# Patient Record
Sex: Male | Born: 1985 | Race: Black or African American | Hispanic: No | Marital: Single | Smoking: Current some day smoker
Health system: Southern US, Community
[De-identification: ages and names within clinical notes are randomized; demographics above are authoritative.]

## PROBLEM LIST (undated history)

## (undated) DIAGNOSIS — M549 Dorsalgia, unspecified: Secondary | ICD-10-CM

## (undated) HISTORY — PX: NO PAST SURGERIES: SHX2092

---
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
## 2003-01-26 ENCOUNTER — Emergency Department (HOSPITAL_COMMUNITY): Admission: EM | Admit: 2003-01-26 | Discharge: 2003-01-26 | Payer: Self-pay | Admitting: Emergency Medicine

## 2005-06-05 ENCOUNTER — Emergency Department (HOSPITAL_COMMUNITY): Admission: EM | Admit: 2005-06-05 | Discharge: 2005-06-05 | Payer: Self-pay | Admitting: Emergency Medicine

## 2009-01-19 ENCOUNTER — Emergency Department (HOSPITAL_COMMUNITY): Admission: EM | Admit: 2009-01-19 | Discharge: 2009-01-19 | Payer: Self-pay | Admitting: Emergency Medicine

## 2009-10-24 ENCOUNTER — Emergency Department (HOSPITAL_COMMUNITY): Admission: EM | Admit: 2009-10-24 | Discharge: 2009-10-24 | Payer: Self-pay | Admitting: Emergency Medicine

## 2010-04-14 ENCOUNTER — Emergency Department (HOSPITAL_COMMUNITY)
Admission: EM | Admit: 2010-04-14 | Discharge: 2010-04-14 | Disposition: A | Discharge status: Home / Self Care | Payer: Self-pay | Attending: Emergency Medicine | Admitting: Emergency Medicine

## 2010-04-14 DIAGNOSIS — H109 Unspecified conjunctivitis: Secondary | ICD-10-CM | POA: Insufficient documentation

## 2010-04-14 DIAGNOSIS — H5710 Ocular pain, unspecified eye: Secondary | ICD-10-CM | POA: Insufficient documentation

## 2010-07-30 ENCOUNTER — Emergency Department (HOSPITAL_COMMUNITY): Payer: Self-pay

## 2010-07-30 ENCOUNTER — Emergency Department (HOSPITAL_COMMUNITY)
Admission: EM | Admit: 2010-07-30 | Discharge: 2010-07-30 | Disposition: A | Discharge status: Home / Self Care | Payer: Self-pay | Attending: Emergency Medicine | Admitting: Emergency Medicine

## 2010-07-30 DIAGNOSIS — I517 Cardiomegaly: Secondary | ICD-10-CM | POA: Insufficient documentation

## 2010-07-30 DIAGNOSIS — R079 Chest pain, unspecified: Secondary | ICD-10-CM | POA: Insufficient documentation

## 2010-07-30 DIAGNOSIS — R0602 Shortness of breath: Secondary | ICD-10-CM | POA: Insufficient documentation

## 2010-07-30 DIAGNOSIS — R091 Pleurisy: Secondary | ICD-10-CM | POA: Insufficient documentation

## 2011-09-21 ENCOUNTER — Emergency Department (HOSPITAL_COMMUNITY)
Admission: EM | Admit: 2011-09-21 | Discharge: 2011-09-21 | Disposition: A | Discharge status: Home / Self Care | Payer: Self-pay | Attending: Emergency Medicine | Admitting: Emergency Medicine

## 2011-09-21 ENCOUNTER — Encounter (HOSPITAL_COMMUNITY): Payer: Self-pay | Admitting: Emergency Medicine

## 2011-09-21 DIAGNOSIS — S335XXA Sprain of ligaments of lumbar spine, initial encounter: Secondary | ICD-10-CM | POA: Insufficient documentation

## 2011-09-21 DIAGNOSIS — Y9389 Activity, other specified: Secondary | ICD-10-CM | POA: Insufficient documentation

## 2011-09-21 DIAGNOSIS — S39012A Strain of muscle, fascia and tendon of lower back, initial encounter: Secondary | ICD-10-CM

## 2011-09-21 DIAGNOSIS — X58XXXA Exposure to other specified factors, initial encounter: Secondary | ICD-10-CM | POA: Insufficient documentation

## 2011-09-21 DIAGNOSIS — Y998 Other external cause status: Secondary | ICD-10-CM | POA: Insufficient documentation

## 2011-09-21 HISTORY — DX: Dorsalgia, unspecified: M54.9

## 2011-09-21 MED ORDER — HYDROCODONE-ACETAMINOPHEN 5-325 MG PO TABS: ORAL_TABLET | ORAL | Status: AC

## 2011-09-21 MED ORDER — IBUPROFEN 400 MG PO TABS
600.0000 mg | ORAL_TABLET | Freq: Once | ORAL | Status: AC
  Administered 2011-09-21: 600 mg via ORAL
  Filled 2011-09-21: qty 1

## 2011-09-21 MED ORDER — IBUPROFEN 600 MG PO TABS: 600.0000 mg | ORAL_TABLET | Freq: Four times a day (QID) | ORAL | Status: AC | PRN

## 2011-09-21 MED ORDER — METHOCARBAMOL 500 MG PO TABS: 1000.0000 mg | ORAL_TABLET | Freq: Four times a day (QID) | ORAL | Status: AC

## 2011-09-21 MED ORDER — HYDROCODONE-ACETAMINOPHEN 5-325 MG PO TABS
1.0000 | ORAL_TABLET | Freq: Once | ORAL | Status: AC
  Administered 2011-09-21: 1 via ORAL
  Filled 2011-09-21: qty 1

## 2011-09-21 NOTE — ED Provider Notes (Signed)
History     CSN: [id number]  Arrival date & time 09/21/11  2013   None     Chief Complaint  Patient presents with  . Back Pain    (Consider location/radiation/quality/duration/timing/severity/associated sxs/prior treatment) HPI Comments: Patient presents with complaint of acute onset back pain described as shooting in nature that started at 7 PM when he stood up from his couch. Patient was holding his 101-month-old daughter. Patient states that he has had problems with his back over the past year that are intermittent in nature. He typically will take some Aleve which helps the pain. Patient does heavy lifting at work. Tonight, at the onset the patient describes pain shooting down his right upper leg. Pain is worse with certain positions or movements. No treatments prior to arrival and nothing will make it better. No red flag signs and symptoms of lower back pain. No dysuria or trouble urinating.  Patient is a 26 y.o. male presenting with back pain. The history is provided by the patient.  Back Pain  This is a new problem. The current episode started 3 to 5 hours ago. The problem occurs constantly. The problem has not changed since onset.The pain is present in the lumbar spine. The quality of the pain is described as shooting. The pain radiates to the right thigh. The pain is moderate. The symptoms are aggravated by bending, twisting and certain positions. Pertinent negatives include no fever, no numbness, no weight loss, no abdominal pain, no bowel incontinence, no perianal numbness, no bladder incontinence, no dysuria, no tingling and no weakness. He has tried nothing for the symptoms.    Past Medical History  Diagnosis Date  . Back pain     History reviewed. No pertinent past surgical history.  No family history on file.  History  Substance Use Topics  . Smoking status: Never Smoker   . Smokeless tobacco: Not on file  . Alcohol Use: Yes      Review of Systems    Constitutional: Negative for fever, weight loss and unexpected weight change.  Gastrointestinal: Negative for abdominal pain, constipation and bowel incontinence.       Neg for fecal incontinence  Genitourinary: Negative for bladder incontinence, dysuria, hematuria, flank pain and difficulty urinating.       Negative for urinary incontinence or retention  Musculoskeletal: Positive for back pain.  Neurological: Negative for tingling, weakness and numbness.       Negative for saddle paresthesias     Allergies  Review of patient's allergies indicates no known allergies.  Home Medications   Current Outpatient Rx  Name Route Sig Dispense Refill  . MUSCLE RUB 10-15 % EX CREA Topical Apply 1 application topically as needed. For pain      BP 116/64  Pulse 63  Temp 98.6 F (37 C) (Oral)  Resp 16  SpO2 100%  Physical Exam  Nursing note and vitals reviewed. Constitutional: He appears well-developed and well-nourished.  HENT:  Head: Normocephalic and atraumatic.  Eyes: Conjunctivae are normal.  Neck: Normal range of motion.  Abdominal: Soft. There is no tenderness. There is no CVA tenderness.  Musculoskeletal: He exhibits no tenderness.       Cervical back: He exhibits normal range of motion, no tenderness and no bony tenderness.       Thoracic back: He exhibits normal range of motion, no tenderness and no bony tenderness.       Lumbar back: He exhibits tenderness. He exhibits normal range of motion and no bony  tenderness.       Back:       No step-off noted with palpation of spine.   Neurological: He is alert. He has normal reflexes. No sensory deficit. He exhibits normal muscle tone.       5/5 strength in entire lower extremities bilaterally. No sensation deficit.   Skin: Skin is warm and dry.  Psychiatric: He has a normal mood and affect.    ED Course  Procedures (including critical care time)  Labs Reviewed - No data to display No results found.   1. Lumbar strain      10:39 PM Patient seen and examined. Medications ordered.   Vital signs reviewed and are as follows: Filed Vitals:   09/21/11 2028  BP: 116/64  Pulse: 63  Temp: 98.6 F (37 C)  Resp: 16   No red flag s/s of low back pain. Patient was counseled on back pain precautions and told to do activity as tolerated but do not lift, push, or pull heavy objects more than 10 pounds for the next week.  Patient counseled to use ice or heat on back for no longer than 15 minutes every hour.   Patient prescribed muscle relaxer and counseled on proper use of muscle relaxant medication.    Patient prescribed narcotic pain medicine and counseled on proper use of narcotic pain medications. Counseled not to combine this medication with others containing tylenol.   Urged patient not to drink alcohol, drive, or perform any other activities that requires focus while taking either of these medications.  Patient urged to follow-up with PCP if pain does not improve with treatment and rest or if pain becomes recurrent. Urged to return with worsening severe pain, loss of bowel or bladder control, trouble walking.   The patient verbalizes understanding and agrees with the plan.   MDM  Patient with back pain. No neurological deficits. Patient is ambulatory. No warning symptoms of back pain including: loss of bowel or bladder control, night sweats, waking from sleep with back pain, unexplained fevers or weight loss, h/o cancer, IVDU, recent trauma. No concern for cauda equina, epidural abscess, or other serious cause of back pain. No urinary symptoms. Conservative measures such as rest, ice/heat and pain medicine indicated with PCP follow-up if no improvement with conservative management.          Renne Crigler, [place] 09/21/11 [phone]

## 2011-09-21 NOTE — ED Notes (Signed)
C/o lower back that radiates down R leg since bending over to pick daughter up around 7pm tonight.  Reports history of back pain.

## 2011-09-23 NOTE — ED Provider Notes (Signed)
Medical screening examination/treatment/procedure(s) were performed by non-physician practitioner and as supervising physician I was immediately available for consultation/collaboration.   Chinara Hertzberg H Calem Cocozza, MD 09/23/11 0724 

## 2012-09-14 ENCOUNTER — Ambulatory Visit: Payer: Self-pay | Attending: Internal Medicine

## 2012-09-21 ENCOUNTER — Ambulatory Visit: Payer: Self-pay

## 2012-09-22 ENCOUNTER — Ambulatory Visit: Payer: Self-pay

## 2012-11-26 ENCOUNTER — Ambulatory Visit: Payer: BC Managed Care – PPO | Attending: Internal Medicine | Admitting: Internal Medicine

## 2012-11-26 ENCOUNTER — Ambulatory Visit (HOSPITAL_COMMUNITY)
Admission: RE | Admit: 2012-11-26 | Discharge: 2012-11-26 | Disposition: A | Discharge status: Home / Self Care | Payer: BC Managed Care – PPO | Source: Ambulatory Visit | Attending: Internal Medicine | Admitting: Internal Medicine

## 2012-11-26 VITALS — BP 100/66 | HR 65 | Temp 98.0°F | Resp 16 | Wt 151.0 lb

## 2012-11-26 DIAGNOSIS — M47817 Spondylosis without myelopathy or radiculopathy, lumbosacral region: Secondary | ICD-10-CM | POA: Insufficient documentation

## 2012-11-26 DIAGNOSIS — M542 Cervicalgia: Secondary | ICD-10-CM | POA: Insufficient documentation

## 2012-11-26 DIAGNOSIS — M545 Low back pain, unspecified: Secondary | ICD-10-CM | POA: Insufficient documentation

## 2012-11-26 DIAGNOSIS — M549 Dorsalgia, unspecified: Secondary | ICD-10-CM

## 2012-11-26 DIAGNOSIS — Z79899 Other long term (current) drug therapy: Secondary | ICD-10-CM | POA: Insufficient documentation

## 2012-11-26 DIAGNOSIS — M4040 Postural lordosis, site unspecified: Secondary | ICD-10-CM | POA: Insufficient documentation

## 2012-11-26 MED ORDER — ACETAMINOPHEN 325 MG PO TABS: 650.0000 mg | ORAL_TABLET | Freq: Four times a day (QID) | ORAL | Status: DC | PRN

## 2012-11-26 MED ORDER — CYCLOBENZAPRINE HCL 5 MG PO TABS: 5.0000 mg | ORAL_TABLET | Freq: Two times a day (BID) | ORAL | Status: DC | PRN

## 2012-11-26 MED ORDER — NAPROXEN 500 MG PO TABS: 500.0000 mg | ORAL_TABLET | Freq: Two times a day (BID) | ORAL | Status: DC

## 2012-11-26 NOTE — Progress Notes (Signed)
Patient complains of back pain Also neck pain past two weeks Denies any injury

## 2012-11-26 NOTE — Progress Notes (Signed)
Patient Demographics  Matthew Valentine, is a 27 y.o. male  HQI:[id number]  XLK:[id number]  DOB - Nov 30, 1985  Chief Complaint  Patient presents with  . Back Pain        Subjective:   Matthew Valentine today is here to establish primary care. Patient is a 27 year old [place] American male with no significant past medical history presents here to establish primary care. Patient has been having ongoing low back pain for the past one month and takes as needed over-the-counter nonsteroidal anti-inflammatory medications and Tylenol, and also uses a heating pad at times. For the past 2 weeks he is also developing neck pain. He does not have any muscle weakness, urinary incontinence or fecal incontinence. There is no history of fever. He presents for further evaluation and treatment to  Currently patient has no complaints. Patient has also has No headache, No chest pain, No abdominal pain,No Nausea, No new weakness tingling or numbness, No Cough or SOB.  Objective:    Filed Vitals:   11/26/12 0913  BP: 100/66  Pulse: 65  Temp: 98 F (36.7 C)  Resp: 16  Weight: 151 lb (68.493 kg)  SpO2: 100%     ALLERGIES:  No Known Allergies  PAST MEDICAL HISTORY: Past Medical History  Diagnosis Date  . Back pain     PAST SURGICAL HISTORY: History reviewed. No pertinent past surgical history.  FAMILY HISTORY: Family history of prostate cancer  MEDICATIONS AT HOME: Prior to Admission medications   Medication Sig Start Date End Date Taking? Authorizing Provider  acetaminophen (TYLENOL) 325 MG tablet Take 2 tablets (650 mg total) by mouth every 6 (six) hours as needed for pain. 11/26/12   Erin Obando Levora Dredge, MD  cyclobenzaprine (FLEXERIL) 5 MG tablet Take 1 tablet (5 mg total) by mouth 2 (two) times daily as needed for muscle spasms. 11/26/12   Malayja Freund Levora Dredge, MD  Menthol-Methyl Salicylate (MUSCLE RUB) 10-15 % CREA Apply 1 application topically as needed. For pain    Historical Provider, MD   naproxen (NAPROSYN) 500 MG tablet Take 1 tablet (500 mg total) by mouth 2 (two) times daily with a meal. 11/26/12   Maretta Bees, MD    SOCIAL HISTORY:   reports that he has never smoked. He does not have any smokeless tobacco history on file. He reports that he drinks alcohol. He reports that he does not use illicit drugs.  REVIEW OF SYSTEMS:  Constitutional:   No   Fevers, chills, fatigue.  HEENT:    No headaches, Sore throat,   Cardio-vascular: No chest pain,  Orthopnea, swelling in lower extremities, anasarca, palpitations  GI:  No abdominal pain, nausea, vomiting, diarrhea  Resp: No shortness of breath,  No coughing up of blood.No cough.No wheezing.  Skin:  no rash or lesions.  GU:  no dysuria, change in color of urine, no urgency or frequency.  No flank pain.  Musculoskeletal: No joint pain or swelling.  No decreased range of motion.    Psych: No change in mood or affect. No depression or anxiety.  No memory loss.   Exam  General appearance :Awake, alert, not in any distress. Speech Clear. Not toxic Looking HEENT: Atraumatic and Normocephalic, pupils equally reactive to light and accomodation Neck: supple, no JVD. No cervical lymphadenopathy.  Chest:Good air entry bilaterally, no added sounds  CVS: S1 S2 regular, no murmurs.  Abdomen: Bowel sounds present, Non tender and not distended with no gaurding, rigidity or rebound. Extremities: B/L Lower Ext shows no edema,  both legs are warm to touch Neurology: Awake alert, and oriented X 3, CN II-XII intact, Non focal- 5/5 strength in all 4 extremities. Sensation grossly intact. Skin:No Rash Wounds:N/A    Data Review   CBC No results found for this basename: WBC, HGB, HCT, PLT, MCV, MCH, MCHC, RDW, NEUTRABS, LYMPHSABS, MONOABS, EOSABS, BASOSABS, BANDABS, BANDSABD,  in the last 168 hours  Chemistries   No results found for this basename: NA, K, CL, CO2, GLUCOSE, BUN, CREATININE, GFRCGP, CALCIUM, MG, AST,  ALT, ALKPHOS, BILITOT,  in the last 168 hours ------------------------------------------------------------------------------------------------------------------ No results found for this basename: HGBA1C,  in the last 72 hours ------------------------------------------------------------------------------------------------------------------ No results found for this basename: CHOL, HDL, LDLCALC, TRIG, CHOLHDL, LDLDIRECT,  in the last 72 hours ------------------------------------------------------------------------------------------------------------------ No results found for this basename: TSH, T4TOTAL, FREET3, T3FREE, THYROIDAB,  in the last 72 hours ------------------------------------------------------------------------------------------------------------------ No results found for this basename: VITAMINB12, FOLATE, FERRITIN, TIBC, IRON, RETICCTPCT,  in the last 72 hours  Coagulation profile  No results found for this basename: INR, PROTIME,  in the last 168 hours    Assessment & Plan   Back pain/neck - Check lumbar and cervical spine x-rays - Will place on Flexeril, continue with nonsteroidal anti-inflammatory medications and Tylenol as needed - Followup in one month- depending on the x-rays and clinical symptomatology/re-assessment we'll decide on further workup  Will check baseline CBC, chemistries, A1c, lipids and TSH   Follow up in one month  The patient was given clear instructions to go to ER or return to medical center if symptoms don't improve, worsen or new problems develop. The patient verbalized understanding. The patient was told to call to get lab results if they haven't heard anything in the next week.

## 2012-12-06 ENCOUNTER — Ambulatory Visit: Payer: Self-pay

## 2013-08-16 ENCOUNTER — Emergency Department (HOSPITAL_COMMUNITY)
Admission: EM | Admit: 2013-08-16 | Discharge: 2013-08-16 | Disposition: A | Discharge status: Home / Self Care | Payer: No Typology Code available for payment source | Attending: Emergency Medicine | Admitting: Emergency Medicine

## 2013-08-16 ENCOUNTER — Encounter (HOSPITAL_COMMUNITY): Payer: Self-pay | Admitting: Emergency Medicine

## 2013-08-16 DIAGNOSIS — L0211 Cutaneous abscess of neck: Secondary | ICD-10-CM | POA: Insufficient documentation

## 2013-08-16 DIAGNOSIS — L03221 Cellulitis of neck: Principal | ICD-10-CM

## 2013-08-16 DIAGNOSIS — L0291 Cutaneous abscess, unspecified: Secondary | ICD-10-CM

## 2013-08-16 DIAGNOSIS — Z79899 Other long term (current) drug therapy: Secondary | ICD-10-CM | POA: Insufficient documentation

## 2013-08-16 DIAGNOSIS — R51 Headache: Secondary | ICD-10-CM | POA: Insufficient documentation

## 2013-08-16 MED ORDER — IBUPROFEN 800 MG PO TABS: 800.0000 mg | ORAL_TABLET | Freq: Three times a day (TID) | ORAL | Status: DC

## 2013-08-16 MED ORDER — OXYCODONE-ACETAMINOPHEN 5-325 MG PO TABS: 1.0000 | ORAL_TABLET | ORAL | Status: DC | PRN

## 2013-08-16 MED ORDER — CEPHALEXIN 500 MG PO CAPS: 500.0000 mg | ORAL_CAPSULE | Freq: Four times a day (QID) | ORAL | Status: DC

## 2013-08-16 MED ORDER — OXYCODONE-ACETAMINOPHEN 5-325 MG PO TABS
1.0000 | ORAL_TABLET | Freq: Once | ORAL | Status: AC
  Administered 2013-08-16: 1 via ORAL
  Filled 2013-08-16: qty 1

## 2013-08-16 NOTE — ED Notes (Signed)
Pt reports having abscess to back of neck for extended amount of time. Has increased in size and pain. Denies fever or chills.

## 2013-08-16 NOTE — ED Notes (Signed)
sts he had his abscess on neck draiined

## 2013-08-16 NOTE — ED Provider Notes (Signed)
CSN: [id number]     Arrival date & time 08/16/13  1342 History  This chart was scribed for non-physician practitioner Mellody DrownLauren Maryn Freelove working with Ward GivensIva L Knapp, MD by Carl Bestelina Holson, ED Scribe. This patient was seen in room TR08C/TR08C and the patient's care was started at 1:52 PM.   Chief Complaint  Patient presents with  . Abscess   HPI Comments: Matthew Valentine is a 28 y.o. male who presents to the Emergency Department complaining of a painful abscess located on the back of his neck that he noticed weeks ago.  He states that the abscess has gradually increased in size.  The patient describes the pain as burning.  He denies fever as an associated symptom.  He states that he has a history of abscesses.  The patient denies being allergic to any medication.  The patient denies having a history of DM, HIV, or other immunocompromising disease.  He denies having a PCP.    Patient is a 28 y.o. male presenting with abscess. The history is provided by the patient. No language interpreter was used.  Abscess Associated symptoms: no fever     Past Medical History  Diagnosis Date  . Back pain    History reviewed. No pertinent past surgical history. History reviewed. No pertinent family history. History  Substance Use Topics  . Smoking status: Never Smoker   . Smokeless tobacco: Not on file  . Alcohol Use: Yes    Review of Systems  Constitutional: Negative for fever and chills.  Skin: Positive for wound. Negative for rash.  Allergic/Immunologic: Negative for immunocompromised state.  All other systems reviewed and are negative.     Allergies  Review of patient's allergies indicates no known allergies.  Home Medications   Prior to Admission medications   Medication Sig Start Date End Date Taking? Authorizing Provider  acetaminophen (TYLENOL) 325 MG tablet Take 2 tablets (650 mg total) by mouth every 6 (six) hours as needed for pain. 11/26/12   Shanker Levora DredgeM Ghimire, MD  cyclobenzaprine  (FLEXERIL) 5 MG tablet Take 1 tablet (5 mg total) by mouth 2 (two) times daily as needed for muscle spasms. 11/26/12   Shanker Levora DredgeM Ghimire, MD  Menthol-Methyl Salicylate (MUSCLE RUB) 10-15 % CREA Apply 1 application topically as needed. For pain    Historical Provider, MD  naproxen (NAPROSYN) 500 MG tablet Take 1 tablet (500 mg total) by mouth 2 (two) times daily with a meal. 11/26/12   Shanker Levora DredgeM Ghimire, MD   BP 139/81  Pulse 66  Temp(Src) 98.6 F (37 C) (Oral)  Resp 18  SpO2 99% Physical Exam  Nursing note and vitals reviewed. Constitutional: He is oriented to person, place, and time. He appears well-developed and well-nourished.  Non-toxic appearance. He does not have a sickly appearance. He does not appear ill. No distress.  HENT:  Head: Normocephalic and atraumatic.  Eyes: EOM are normal.  Neck: Normal range of motion.  Cardiovascular: Normal rate.   Pulmonary/Chest: Effort normal.  Musculoskeletal: Normal range of motion.  Neurological: He is alert and oriented to person, place, and time.  Skin: Skin is warm and dry. He is not diaphoretic.  3x3 raised area with central fluctuance and surrounding induration. No surrounding erythema  Psychiatric: He has a normal mood and affect. His behavior is normal.    ED Course  Procedures (including critical care time)  INCISION AND DRAINAGE  Performed by: Mellody DrownLauren Janina Trafton, PA-C Authorized by: Devoria AlbeIva Knapp, MD   Consent - Verbal Consent obtained  Risks and benefits: risks/benefits and alternatives were discussed  Type: Abscess  Body Area: posterior neck, hairline  Anesthesia: Local infiltration Local anesthetic: lidocaine 2% with epinephrine  Anesthetic total: 5 ml  Complexity: Complex  Blunt dissection to break up loculations  Drainage: Purulent  Drainage amount: Moderate   Packing material: 1/4 iodoform gauze  Patient tolerance: Patient tolerated the procedure well with no immediate complications   MDM   Final diagnoses:   Abscess   Patient with abscess to posterior neck hairline. I&D tolerated without complaints. No overlying erythema to suggest cellulitis. Plan on wound check in 48 hours for packing removal. Return precautions given. Reports understanding and no other concerns at this time.  Patient is stable for discharge at this time.  Meds given in ED:  Medications - No data to display  Discharge Medication List as of 08/16/2013  3:41 PM    START taking these medications   Details  ibuprofen (ADVIL,MOTRIN) 800 MG tablet Take 1 tablet (800 mg total) by mouth 3 (three) times daily., Starting 08/16/2013, Until Discontinued, Print         I personally performed the services described in this documentation, which was scribed in my presence. The recorded information has been reviewed and is accurate.    Clabe Seal, PA-C 08/17/13 [phone]

## 2013-08-16 NOTE — ED Provider Notes (Signed)
CSN: [id number]     Arrival date & time 08/16/13  1642 History  This chart was scribed for Jaynie Crumble, PA-C working with Richardean Canal, MD by Evon Slack, ED Scribe. This patient was seen in room TR05C/TR05C and the patient's care was started at 5:47 PM.      Chief Complaint  Patient presents with  . Abscess   Patient is a 28 y.o. male presenting with abscess. The history is provided by the patient. No language interpreter was used.  Abscess Associated symptoms: headaches   Associated symptoms: no fever    HPI Comments: Matthew Valentine is a 28 y.o. male who presents to the Emergency Department  For an abscess earlier today. He states he didn't receive any pain medication during his visit earlier today. He states the pain worsened once leaving the ED. He states the pain is throbbing. Denies any fever, chills.  Past Medical History  Diagnosis Date  . Back pain    History reviewed. No pertinent past surgical history. History reviewed. No pertinent family history. History  Substance Use Topics  . Smoking status: Never Smoker   . Smokeless tobacco: Not on file  . Alcohol Use: Yes    Review of Systems  Constitutional: Negative for fever and chills.  Musculoskeletal: Positive for neck pain.  Skin: Positive for wound.  Neurological: Positive for headaches.   Allergies  Review of patient's allergies indicates no known allergies.  Home Medications   Prior to Admission medications   Medication Sig Start Date End Date Taking? Authorizing Provider  ibuprofen (ADVIL,MOTRIN) 800 MG tablet Take 1 tablet (800 mg total) by mouth 3 (three) times daily. 08/16/13   Clabe Seal, PA-C   Triage Vitals: BP 131/89  Pulse 61  Temp(Src) 98.2 F (36.8 C) (Oral)  Resp 20  Ht 5\' 10"  (1.778 m)  Wt 160 lb (72.576 kg)  BMI 22.96 kg/m2  SpO2 99%  Physical Exam  Nursing note and vitals reviewed. Constitutional: He is oriented to person, place, and time. He appears well-developed  and well-nourished. No distress.  HENT:  Head: Normocephalic and atraumatic.  Eyes: Conjunctivae and EOM are normal.  Neck: Neck supple. No tracheal deviation present.  Cardiovascular: Normal rate.   Pulmonary/Chest: Effort normal. No respiratory distress.  Musculoskeletal: Normal range of motion.  Neurological: He is alert and oriented to person, place, and time.  Skin: Skin is warm and dry.  3x3 cm abscess to back of neck already incised and packed , no surrounding erythema, no drainage from abscess. Tender to palpation  Psychiatric: He has a normal mood and affect. His behavior is normal.    ED Course  Procedures (including critical care time) DIAGNOSTIC STUDIES: Oxygen Saturation is 99% on RA, normal by my interpretation.    COORDINATION OF CARE: 6:29 PM-Discussed treatment plan which includes pain medication and antibiotics with pt at bedside and pt agreed to plan.     Labs Review Labs Reviewed - No data to display  Imaging Review No results found.   EKG Interpretation None      MDM   Final diagnoses:  Abscess of neck   Patient was just seen here a few hours ago and had an abscess of the back of the neck incised and drained, packed. He presents here because the pain has worsened since discharge. He did not get any prescriptions for pain medications her in a biotics, requesting an antibiotic. Given her Percocet in emergency department, will discharge home with Percocet and  Keflex. Followup with primary care doctor or here in 2 days.  Filed Vitals:   08/16/13 1649 08/16/13 1653  BP: 131/89   Pulse: 61   Temp: 98.2 F (36.8 C)   TempSrc: Oral   Resp: 20   Height:  5\' 10"  (1.778 m)  Weight:  160 lb (72.576 kg)  SpO2: 99%     I personally performed the services described in this documentation, which was scribed in my presence. The recorded information has been reviewed and is accurate.    Lottie Musselatyana A Lori Liew, PA-C 08/16/13 1840

## 2013-08-16 NOTE — Discharge Instructions (Signed)
Warm compresses to the area. Ibuprofen for pain. Percocet for severe pain. Keflex for infection. Follow up in 2 days. .    Abscess An abscess is an infected area that contains a collection of pus and debris.It can occur in almost any part of the body. An abscess is also known as a furuncle or boil. CAUSES  An abscess occurs when tissue gets infected. This can occur from blockage of oil or sweat glands, infection of hair follicles, or a minor injury to the skin. As the body tries to fight the infection, pus collects in the area and creates pressure under the skin. This pressure causes pain. People with weakened immune systems have difficulty fighting infections and get certain abscesses more often.  SYMPTOMS Usually an abscess develops on the skin and becomes a painful mass that is red, warm, and tender. If the abscess forms under the skin, you may feel a moveable soft area under the skin. Some abscesses break open (rupture) on their own, but most will continue to get worse without care. The infection can spread deeper into the body and eventually into the bloodstream, causing you to feel ill.  DIAGNOSIS  Your caregiver will take your medical history and perform a physical exam. A sample of fluid may also be taken from the abscess to determine what is causing your infection. TREATMENT  Your caregiver may prescribe antibiotic medicines to fight the infection. However, taking antibiotics alone usually does not cure an abscess. Your caregiver may need to make a small cut (incision) in the abscess to drain the pus. In some cases, gauze is packed into the abscess to reduce pain and to continue draining the area. HOME CARE INSTRUCTIONS   Only take over-the-counter or prescription medicines for pain, discomfort, or fever as directed by your caregiver.  If you were prescribed antibiotics, take them as directed. Finish them even if you start to feel better.  If gauze is used, follow your caregiver's  directions for changing the gauze.  To avoid spreading the infection:  Keep your draining abscess covered with a bandage.  Wash your hands well.  Do not share personal care items, towels, or whirlpools with others.  Avoid skin contact with others.  Keep your skin and clothes clean around the abscess.  Keep all follow-up appointments as directed by your caregiver. SEEK MEDICAL CARE IF:   You have increased pain, swelling, redness, fluid drainage, or bleeding.  You have muscle aches, chills, or a general ill feeling.  You have a fever. MAKE SURE YOU:   Understand these instructions.  Will watch your condition.  Will get help right away if you are not doing well or get worse. Document Released: 10/23/2004 Document Revised: 07/15/2011 Document Reviewed: 03/28/2011 [facility] Patient Information 2015 [employer] [place]. This information is not intended to replace advice given to you by your health care provider. Make sure you discuss any questions you have with your health care provider.

## 2013-08-16 NOTE — Discharge Instructions (Signed)
Return to the emergency department for a wound check and packing removal in 48 hours. Call for a follow up appointment with a Family or Primary Care Provider.  Return if Symptoms worsen.   Take medication as prescribed.  Keep the wound clean and dry do not soak in water until the packing has been removed.   Emergency Department Resource Guide 1) Find a Doctor and Pay Out of Pocket Although you won't have to find out who is covered by your insurance plan, it is a good idea to ask around and get recommendations. You will then need to call the office and see if the doctor you have chosen will accept you as a new patient and what types of options they offer for patients who are self-pay. Some doctors offer discounts or will set up payment plans for their patients who do not have insurance, but you will need to ask so you aren't surprised when you get to your appointment.  2) Contact Your Local Health Department Not all health departments have doctors that can see patients for sick visits, but many do, so it is worth a call to see if yours does. If you don't know where your local health department is, you can check in your phone book. The CDC also has a tool to help you locate your state's health department, and many state websites also have listings of all of their local health departments.  3) Find a Walk-in Clinic If your illness is not likely to be very severe or complicated, you may want to try a walk in clinic. These are popping up all over the country in pharmacies, drugstores, and shopping centers. They're usually staffed by nurse practitioners or physician assistants that have been trained to treat common illnesses and complaints. They're usually fairly quick and inexpensive. However, if you have serious medical issues or chronic medical problems, these are probably not your best option.  No Primary Care Doctor: - Call Health Connect at  [phone] - they can help you locate a primary care doctor  that  accepts your insurance, provides certain services, etc. - Physician Referral Service- [phone]  Chronic Pain Problems: Organization         Address  Phone   Notes  [facility]  [phone]) [phone] Patients need to be referred by their primary care doctor.   Medication Assistance: Organization         Address  Phone   Notes  [facility] Medication [facility] [address] [place], [place] [address] [phone] --Must be a resident of [facility] [place] -- Must have NO insurance coverage whatsoever (no Medicaid/ Medicare, etc.) -- The pt. MUST have a primary care doctor that directs their care regularly and follows them in the community   MedAssist  [phone]   [employer]  [phone]    Agencies that provide inexpensive medical care: Organization         Address  Phone   Notes  [facility]  [phone]) [phone]   [facility]    [phone]   [facility] [address] [place], [place] [address] [phone]) [phone]   [facility] [address], [place] [phone]   Planned Parenthood    [phone]   [facility]    [phone]) [phone]   [facility]  [address], [place] Phone:  [phone], Fax:  (  [phone] Hours of Operation:  9 am - 6 pm, M-F.  Also accepts Medicaid/Medicare and self-pay.  [facility] for [place], [address], [place] Phone: [phone], Fax: [phone]. Hours of Operation:  8:30 am - 5:30 pm, M-F.  Also accepts Medicaid and self-pay.  [facility] [place] [address] [place] Phone: [phone]   [place], [place], [place] [phone], Ext. [phone] Mondays & Thursdays: 7-9 AM.  First 15 patients are seen on a first come, first serve basis.    [place] Providers:  Organization          Address  Phone   Notes  [facility] [address], [place] [phone] Also accepts self-pay patients.  [facility] [address], [place]  [phone]   [place], [address], [place] [phone]   [facility] [address] [place] [phone]   Lucianne Lei [address], [place]   [phone] Only accepts [place] Access [place] patients after they have their name applied to their card.   Self-Pay (no insurance) in [facility]:  Organization         Address  Phone   Notes  Sickle Cell Patients, [facility] [place] [phone]   [facility] [place] [phone]   [facility] [place]  [place], [place], [place] [phone]   [facility]/Dr. Osei-Bonsu  [address] [place] or [address], [place] [phone] Phone number for both [place] and [place] locations is the same.  [facility] [address], [place] [phone]   [facility] [address] [place] or [address] [phone]   [facility] [address], [place] [phone], phone; [phone], fax Sees patients 1st and 3rd Saturday of every month.  Must not qualify for public or private insurance (i.e. Medicaid, Medicare, [place] Health Choice, Veterans' Benefits)  Household income should be no more than 200% of the poverty level The clinic cannot treat you if you are pregnant or think you are pregnant  Sexually transmitted diseases are not treated at the clinic.    Dental Care: Organization         Address  Phone  Notes  [facility] of [facility] [place] [phone] Accepts children up to age 53 who are enrolled in [place] or [place]; pregnant women with a Medicaid card; and children who have applied for Medicaid or [place] Health Choice, but were declined, whose parents can pay a reduced fee at time of service.  [facility] of [facility]  [address], [place] [phone] Accepts children up to age 21 who are enrolled in [place] or [place]; pregnant women with a Medicaid card; and children who have applied for Medicaid or [place] Health Choice, but were declined, whose parents can pay a reduced fee at time of service.  [facility] Access PROGRAM  [place] [phone] Patients are seen by appointment only. Walk-ins are not accepted. [place]  [facility] will see patients 54 years of age and older. Monday - Tuesday (8am-5pm) Most Wednesdays (8:30-5pm) $30 per visit, cash only  [facility] Adult Dental Access PROGRAM  [address], [facility] [phone] Patients are seen by appointment only. Walk-ins are not accepted. [place] will see patients 55 years of age and older. One Wednesday Evening (Monthly: Volunteer Based).  $30 per visit, cash only  [place]  [phone] for adults; Children under age 20, call Graduate Pediatric Dentistry at [phone]. Children aged 41-14, please call [phone] to request a pediatric application.  Dental services are provided in all areas of dental care including fillings, crowns and bridges, complete and partial dentures, implants, gum treatment, root canals, and extractions. Preventive care is also provided. Treatment is provided to both adults and children. Patients are selected via a lottery and there is often a waiting list.   [facility] [address], [place]  [phone] [email or web address]   [facility] [address] [place], [place]  [phone], Ext. [phone] Second and Fourth Thursday of each month, opens at 6:30 AM; Clinic ends at 9 AM.  Patients are seen on a first-come first-served basis, and a limited number are seen during each clinic.   [facility]  [address] Hillard Danker [place], [place] [phone]   Eligibility Requirements You must have lived in [place], [place], or [place] counties for at least the last three months.   You cannot be eligible for state or federal sponsored [employer], including [employer], [place], or [employer].   You generally cannot be eligible for healthcare insurance through your employer.    How to apply: Eligibility screenings are held every Tuesday and Wednesday afternoon from 1:00 pm until 4:00 pm. You do not need an appointment for the interview!  [facility] [address] [place], [place]   [place]  [place] Department  [place]  [phone]    Behavioral Health Resources in the Community: Intensive Outpatient Programs Organization         Address  Phone  Notes  [place]. [address] [place], [place] [phone]   [facility] [address] [place], [place]   ADS: Alcohol [facility] Drug S[facility] [address] [place], [place]   [place] [address]  [place], [place] or [phone]   Substance Abuse Resources Organization         Address  Phone  Notes  Alcohol and Drug Services  [phone]   [place]  [phone]   The [place]   Chinita Pester  [phone]   Residential & Outpatient Substance Abuse Program  [phone]   Psychological Services Organization         Address  Phone  Notes  [facility] [place]  [place]  [phone]    [place] [address], [place] or [phone]    Mobile Crisis Teams Organization         Address  Phone  Notes  [facility], Mobile Crisis Care Unit  [phone]   [facility] Psychotherapeutic [facility]  [address] [place], [place]   [facility] [address] [place] [phone]    Self-Help/Support Groups Organization  Address  Phone             Notes  [place]. of [place] - variety of support groups  [place] Call for more information  Narcotics Anonymous (NA), [facility] [address], [employer] [place]  2 meetings at this location   Special educational needs teacher         Address  Phone  Notes  ASAP Residential Treatment [place],    [place]  [phone]   [facility]  [address] [place] [phone], [place], [place]   [place], [place] [phone] Admissions: 8am-3pm M-F  Incentives Substance [place] [address]    [place], [place] [phone]   [facility] [address] [place], [place], [place]   [facility] [address]  [place], [place]   [facility] Programs - Intensive Outpatient [address], [place], [place]   [facility] ([place] [address].,  [place], [place] [phone] or [phone]   [facility]idential Treatment Services (RTS) [address] [place], [place] Accepts Medicaid  [facility] [place] [address]  [place] [phone] Substance Abuse/Addiction Treatment   [facility] Organization         Address  Phone  Notes  [facility]  [phone]   Domenic Schwab, PhD [address] Arlis Porta [place], [place]   [phone] or [phone]   [place]  [place], [place] [phone]   [facility] [address], [place], [place] [phone] Insurance/Medicaid/sponsorship through [facility] and Families [address] [place]                                    [place], [place] [phone] [place] [address], [place] [phone]    Dr. Adele Schilder  [phone]   [facility] Dept. 1) [address] [place] 2) [place] 3)  [place] [address], [place] [phone]  [phone]   [place] [phone] or [phone] (After Hours)

## 2013-08-16 NOTE — ED Notes (Signed)
Pt dischareged 30 minutes ago for same, here now for neck pain, and reports the ibupforen that wrote from him he has taken in the past and he does not see that they will work.

## 2013-08-17 NOTE — ED Provider Notes (Signed)
Medical screening examination/treatment/procedure(s) were performed by non-physician practitioner and as supervising physician I was immediately available for consultation/collaboration.   EKG Interpretation None      Devoria AlbeIva Alesana Magistro, MD, Armando GangFACEP   Ward GivensIva L Abdoulie Tierce, MD 08/17/13 [phone]

## 2013-08-18 ENCOUNTER — Emergency Department (HOSPITAL_COMMUNITY)
Admission: EM | Admit: 2013-08-18 | Discharge: 2013-08-18 | Disposition: A | Discharge status: Home / Self Care | Payer: No Typology Code available for payment source | Attending: Emergency Medicine | Admitting: Emergency Medicine

## 2013-08-18 ENCOUNTER — Encounter (HOSPITAL_COMMUNITY): Payer: Self-pay | Admitting: Emergency Medicine

## 2013-08-18 DIAGNOSIS — Z792 Long term (current) use of antibiotics: Secondary | ICD-10-CM | POA: Insufficient documentation

## 2013-08-18 DIAGNOSIS — L0211 Cutaneous abscess of neck: Secondary | ICD-10-CM | POA: Insufficient documentation

## 2013-08-18 DIAGNOSIS — L03221 Cellulitis of neck: Principal | ICD-10-CM

## 2013-08-18 NOTE — ED Notes (Signed)
DSY to upper neck D&I.

## 2013-08-18 NOTE — ED Provider Notes (Signed)
CSN: [id number]     Arrival date & time 08/18/13  2046 History  This chart was scribed for non-physician practitioner, Fayrene HelperBowie Kross Swallows, PA-C, working with No att. providers found, by Bronson CurbJacqueline Melvin, ED Scribe. This patient was seen in room TR09C/TR09C and the patient's care was started at 9:19 PM.    Chief Complaint  Patient presents with  . Wound Check     The history is provided by the patient. No language interpreter was used.    HPI Comments: Matthew Valentine is a 28 y.o. male who presents to the Emergency Department for wound check. Patient states he had an cyst to the posterior neck that was incised and drained 2 days ago and is here for a follow up. Patient reports his pain still persists. He denies fever. Patient has history of back pain.  Pt does have pain medication and abx which he is taking.  Pt has not perform warm compress as he is not aware of it.     Past Medical History  Diagnosis Date  . Back pain    History reviewed. No pertinent past surgical history. No family history on file. History  Substance Use Topics  . Smoking status: Never Smoker   . Smokeless tobacco: Not on file  . Alcohol Use: Yes    Review of Systems  Constitutional: Negative for fever and chills.  Skin:       Cyst to posterior neck      Allergies  Review of patient's allergies indicates no known allergies.  Home Medications   Prior to Admission medications   Medication Sig Start Date End Date Taking? Authorizing Provider  cephALEXin (KEFLEX) 500 MG capsule Take 1 capsule (500 mg total) by mouth 4 (four) times daily. 08/16/13  Yes Tatyana A Kirichenko, PA-C  oxyCODONE-acetaminophen (PERCOCET) 5-325 MG per tablet Take 1 tablet by mouth every 4 (four) hours as needed for severe pain. 08/16/13  Yes Tatyana A Kirichenko, PA-C   Triage Vitals: BP 116/65  Pulse 61  Temp(Src) 98.5 F (36.9 C) (Oral)  Resp 18  SpO2 98%  Physical Exam  Nursing note and vitals reviewed. Constitutional: He is  oriented to person, place, and time. He appears well-developed and well-nourished. No distress.  HENT:  Head: Normocephalic and atraumatic.  Eyes: Conjunctivae and EOM are normal.  Neck: Neck supple. No tracheal deviation present.  Cardiovascular: Normal rate.   Pulmonary/Chest: Effort normal. No respiratory distress.  Musculoskeletal: Normal range of motion.  Neurological: He is alert and oriented to person, place, and time.  Skin: Skin is warm and dry.  Area of induration and fluctuant noted to the posterior aspect of neck TTP with dressing in place. Old surgical opening actively oozing some pustular discharge.  Psychiatric: He has a normal mood and affect. His behavior is normal.    ED Course  Procedures (including critical care time)  DIAGNOSTIC STUDIES: Oxygen Saturation is 98% on room air, normal by my interpretation.    COORDINATION OF CARE: At 2134 Pt has a cutaneous abscess to back of neck that is not adequately drained.  Still has moderate induration and fluctuance on exam, moderately tender.  Discussed treatment plan with patient which includes I&D. Patient agrees.   INCISION AND DRAINAGE PROCEDURE NOTE: Patient identification was confirmed and verbal consent was obtained. This procedure was performed by Fayrene HelperBowie Makana Feigel, PA-C at 9:34 PM. Site: posterior neck Sterile procedures observed Needle size: 25 gauze Anesthetic used (type and amt): 3 Blade size: 11 Drainage: mild Complexity: Complex  Packing used, 1/4 iodoform packing Site anesthetized, incision made over site, wound drained and explored loculations, rinsed with copious amounts of normal saline, wound packed with sterile gauze, covered with dry, sterile dressing.  Pt tolerated procedure well without complications.  Instructions for care discussed verbally and pt provided with additional written instructions for homecare and f/u.   Labs Review Labs Reviewed - No data to display  Imaging Review No results  found.   EKG Interpretation None      MDM   Final diagnoses:  Cutaneous abscess of neck    BP 116/65  Pulse 61  Temp(Src) 98.5 F (36.9 C) (Oral)  Resp 18  SpO2 98%   I personally performed the services described in this documentation, which was scribed in my presence. The recorded information has been reviewed and is accurate.     Fayrene Helper, PA-C 08/18/13 2232

## 2013-08-18 NOTE — ED Notes (Signed)
Pt. is here for follow up / wound check of abscess at back of neck that was incised/drained 2 days ago . No bleeding / dressing dry and intact.

## 2013-08-18 NOTE — Discharge Instructions (Signed)
Remember to apply warm moist compress to affected area 3-5 times daily for 10-20 min each.  You may remove packing in 2 days.  If no improvement, please follow up at urgent care for further care.   Abscess Care After An abscess (also called a boil or furuncle) is an infected area that contains a collection of pus. Signs and symptoms of an abscess include pain, tenderness, redness, or hardness, or you may feel a moveable soft area under your skin. An abscess can occur anywhere in the body. The infection may spread to surrounding tissues causing cellulitis. A cut (incision) by the surgeon was made over your abscess and the pus was drained out. Gauze may have been packed into the space to provide a drain that will allow the cavity to heal from the inside outwards. The boil may be painful for 5 to 7 days. Most people with a boil do not have high fevers. Your abscess, if seen early, may not have localized, and may not have been lanced. If not, another appointment may be required for this if it does not get better on its own or with medications. HOME CARE INSTRUCTIONS   Only take over-the-counter or prescription medicines for pain, discomfort, or fever as directed by your caregiver.  When you bathe, soak and then remove gauze or iodoform packs at least daily or as directed by your caregiver. You may then wash the wound gently with mild soapy water. Repack with gauze or do as your caregiver directs. SEEK IMMEDIATE MEDICAL CARE IF:   You develop increased pain, swelling, redness, drainage, or bleeding in the wound site.  You develop signs of generalized infection including muscle aches, chills, fever, or a general ill feeling.  An oral temperature above 102 F (38.9 C) develops, not controlled by medication. See your caregiver for a recheck if you develop any of the symptoms described above. If medications (antibiotics) were prescribed, take them as directed. Document Released: 08/01/2004 Document  Revised: 04/07/2011 Document Reviewed: 03/29/2007 [facility] Patient Information 2015 [place], [place]. This information is not intended to replace advice given to you by your health care provider. Make sure you discuss any questions you have with your health care provider.

## 2013-08-18 NOTE — ED Provider Notes (Signed)
Medical screening examination/treatment/procedure(s) were performed by non-physician practitioner and as supervising physician I was immediately available for consultation/collaboration.   EKG Interpretation None        Matthew Mowrey H Austin Pongratz, MD 08/18/13 1051 

## 2013-08-18 NOTE — ED Notes (Signed)
Declined W/C at D/C and was escorted to lobby by RN. 

## 2013-08-19 NOTE — ED Provider Notes (Signed)
Medical screening examination/treatment/procedure(s) were performed by non-physician practitioner and as supervising physician I was immediately available for consultation/collaboration.   EKG Interpretation None        Kaytelyn Glore, MD 08/19/13 0143 

## 2013-09-21 ENCOUNTER — Emergency Department (HOSPITAL_COMMUNITY)
Admission: EM | Admit: 2013-09-21 | Discharge: 2013-09-21 | Disposition: A | Discharge status: Home / Self Care | Payer: No Typology Code available for payment source | Attending: Family Medicine | Admitting: Family Medicine

## 2013-09-21 ENCOUNTER — Encounter (HOSPITAL_COMMUNITY): Payer: Self-pay | Admitting: Emergency Medicine

## 2013-09-21 DIAGNOSIS — L0211 Cutaneous abscess of neck: Secondary | ICD-10-CM | POA: Diagnosis not present

## 2013-09-21 DIAGNOSIS — L03221 Cellulitis of neck: Principal | ICD-10-CM

## 2013-09-21 DIAGNOSIS — Z792 Long term (current) use of antibiotics: Secondary | ICD-10-CM | POA: Diagnosis not present

## 2013-09-21 MED ORDER — SULFAMETHOXAZOLE-TMP DS 800-160 MG PO TABS: 1.0000 | ORAL_TABLET | Freq: Two times a day (BID) | ORAL | Status: DC

## 2013-09-21 NOTE — ED Provider Notes (Signed)
CSN: [id number]     Arrival date & time 09/21/13  1847 History   First MD Initiated Contact with Patient 09/21/13 1858     Chief Complaint  Patient presents with  . Abscess     (Consider location/radiation/quality/duration/timing/severity/associated sxs/prior Treatment) HPI Comments: 28 year old male presents complaining of an abscess on the back of his neck. He had this originally one month ago, he had it incised and drained on 2 separate occasions and completed all the antibiotics he was given as prescribed. 3 days ago he started to have repeated swelling and tenderness in his place on his neck. He denies any systemic symptoms. He feels like this to get all the way better until 3 days ago.   Patient is a 28 y.o. male presenting with abscess.  Abscess   Past Medical History  Diagnosis Date  . Back pain    History reviewed. No pertinent past surgical history. No family history on file. History  Substance Use Topics  . Smoking status: Never Smoker   . Smokeless tobacco: Not on file  . Alcohol Use: Yes    Review of Systems  Skin:       See history of present illness  All other systems reviewed and are negative.     Allergies  Review of patient's allergies indicates no known allergies.  Home Medications   Prior to Admission medications   Medication Sig Start Date End Date Taking? Authorizing Provider  cephALEXin (KEFLEX) 500 MG capsule Take 1 capsule (500 mg total) by mouth 4 (four) times daily. 08/16/13   Tatyana A Kirichenko, PA-C  oxyCODONE-acetaminophen (PERCOCET) 5-325 MG per tablet Take 1 tablet by mouth every 4 (four) hours as needed for severe pain. 08/16/13   Tatyana A Kirichenko, PA-C  sulfamethoxazole-trimethoprim (BACTRIM DS) 800-160 MG per tablet Take 1 tablet by mouth 2 (two) times daily. 09/21/13   Adrian Blackwater Ahnesti Townsend, PA-C   BP 122/56  Pulse 78  Temp(Src) 98.3 F (36.8 C) (Oral)  Resp 18  Ht  (1.778 m)  Wt 160 lb (72.576 kg)  BMI 22.96 kg/m2  SpO2  99% Physical Exam  Nursing note and vitals reviewed. Constitutional: He is oriented to person, place, and time. He appears well-developed and well-nourished. No distress.  HENT:  Head: Normocephalic.  Neck:    Pulmonary/Chest: Effort normal. No respiratory distress.  Neurological: He is alert and oriented to person, place, and time. Coordination normal.  Skin: Skin is warm and dry. No rash noted. He is not diaphoretic.  Psychiatric: He has a normal mood and affect. Judgment normal.    ED Course  INCISION AND DRAINAGE Date/Time: 09/21/2013 7:32 PM Performed by: Autumn Messing, H Authorized by: Autumn Messing, H Consent: Verbal consent obtained. Risks and benefits: risks, benefits and alternatives were discussed Consent given by: patient Patient identity confirmed: verbally with patient Time out: Immediately prior to procedure a "time out" was called to verify the correct patient, procedure, equipment, support staff and site/side marked as required. Type: abscess Body area: head/neck Location details: neck Anesthesia: local infiltration Local anesthetic: lidocaine 2% with epinephrine Anesthetic total: 4 ml Patient sedated: no Scalpel size: 11 Incision type: single straight Complexity: simple Drainage: purulent Drainage amount: moderate Wound treatment: wound left open Packing material: 1/4 in iodoform gauze Patient tolerance: Patient tolerated the procedure well with no immediate complications.   (including critical care time) Labs Review Labs Reviewed  CULTURE, ROUTINE-ABSCESS    Imaging Review No results found.   EKG Interpretation None  MDM   Final diagnoses:  Abscess, neck    Abscess, incised and drained. Abscess culture sent. We'll treat empirically with Bactrim. Followup in 3 days   Meds ordered this encounter  Medications  . sulfamethoxazole-trimethoprim (BACTRIM DS) 800-160 MG per tablet    Sig: Take 1 tablet by mouth 2 (two) times daily.     Dispense:  28 tablet    Refill:  0    Order Specific Question:  Supervising Provider    Answer:  Bradd Canary D [[id number]]       Graylon Good, PA-C 09/21/13 [phone]

## 2013-09-21 NOTE — Discharge Instructions (Signed)

## 2013-09-21 NOTE — ED Notes (Signed)
Patient has a history of abscess to the back of neck. History of this spot being i/d before.  Last time 3 weeks ago.

## 2013-09-21 NOTE — ED Notes (Signed)
Pt here for evaluation of abscess to back of neck. Pt rates pain 7/10 in head. Pt states this is the 3rd time he has had to have abscess drained.

## 2013-09-21 NOTE — ED Notes (Signed)
Pt presents with abscess to back of neck. Has had it drained twice in the recent past and took antibiotics as prescribed. sts its been back for 3 days.

## 2013-09-25 LAB — CULTURE, ROUTINE-ABSCESS: Culture: NO GROWTH

## 2013-09-27 NOTE — ED Provider Notes (Signed)
Medical screening examination/treatment/procedure(s) were performed by resident physician or non-physician practitioner and as supervising physician I was immediately available for consultation/collaboration.   Jacelyn Cuen DOUGLAS MD.   Jareb Radoncic D Gwendolin Briel, MD 09/27/13 1226 

## 2015-02-08 ENCOUNTER — Emergency Department (HOSPITAL_COMMUNITY)
Admission: EM | Admit: 2015-02-08 | Discharge: 2015-02-08 | Disposition: A | Discharge status: Home / Self Care | Payer: No Typology Code available for payment source | Attending: Emergency Medicine | Admitting: Emergency Medicine

## 2015-02-08 ENCOUNTER — Emergency Department (HOSPITAL_COMMUNITY): Payer: No Typology Code available for payment source

## 2015-02-08 ENCOUNTER — Encounter (HOSPITAL_COMMUNITY): Payer: Self-pay | Admitting: Emergency Medicine

## 2015-02-08 DIAGNOSIS — Y999 Unspecified external cause status: Secondary | ICD-10-CM | POA: Insufficient documentation

## 2015-02-08 DIAGNOSIS — Y9389 Activity, other specified: Secondary | ICD-10-CM | POA: Insufficient documentation

## 2015-02-08 DIAGNOSIS — S60221A Contusion of right hand, initial encounter: Secondary | ICD-10-CM | POA: Insufficient documentation

## 2015-02-08 DIAGNOSIS — Y9289 Other specified places as the place of occurrence of the external cause: Secondary | ICD-10-CM | POA: Insufficient documentation

## 2015-02-08 DIAGNOSIS — W2209XA Striking against other stationary object, initial encounter: Secondary | ICD-10-CM | POA: Insufficient documentation

## 2015-02-08 DIAGNOSIS — Z792 Long term (current) use of antibiotics: Secondary | ICD-10-CM | POA: Insufficient documentation

## 2015-02-08 NOTE — Discharge Instructions (Signed)
Please read and follow all provided instructions.  Your diagnoses today include:  1. Hand contusion, right, initial encounter    Tests performed today include:  Vital signs. See below for your results today.   Medications prescribed:   None   Home care instructions:  Follow any educational materials contained in this packet.  *PRICE in the first 24-48 hours after injury: Protect (with brace, splint, sling), if given by your provider Rest Ice- Do not apply ice pack directly to your skin, place towel or similar between your skin and ice/ice pack. Apply ice for 20 min, then remove for 40 min while awake Compression- Wear brace, elastic bandage, splint as directed by your provider Elevate affected extremity above the level of your heart when not walking around for the first 24-48 hours   Use Ibuprofen (Motrin/Advil) 600mg  every 6 hours as needed for pain   Follow-up instructions: Please follow-up with your primary care provider in the next 48 hours for further evaluation of symptoms and treatment   Return instructions:   Please return to the Emergency Department if you do not get better, if you get worse, or new symptoms OR  - Fever (temperature greater than 101.61F)  - Bleeding that does not stop with holding pressure to the area    -Severe pain (please note that you may be more sore the day after your accident)  - Chest Pain  - Difficulty breathing  - Severe nausea or vomiting  - Inability to tolerate food and liquids  - Passing out  - Skin becoming red around your wounds  - Change in mental status (confusion or lethargy)  - New numbness or weakness     Please return if you have any other emergent concerns.  Additional Information:  Your vital signs today were: BP 102/68 mmHg   Pulse 68   Temp(Src) 98.3 F (36.8 C) (Oral)   Resp 16   Ht 5\' 11"  (1.803 m)   Wt 72.179 kg   BMI 22.20 kg/m2   SpO2 100% If your blood pressure (BP) was elevated above 135/85 this visit, please  have this repeated by your doctor within one month. ---------------

## 2015-02-08 NOTE — ED Notes (Signed)
Pt states for the last month he has had pain in his right hand and hurts to make a fist. Pt states over the last 2 days pain has gotten worse. Pt denies any injury, numbness or tingling. No redness or swelling noted.

## 2015-02-08 NOTE — ED Provider Notes (Signed)
CSN: [id number]     Arrival date & time 02/08/15  1052 History  By signing my name below, I, Jarvis Morgan, attest that this documentation has been prepared under the direction and in the presence of Audry Pili, PA-C Electronically Signed: Jarvis Morgan, ED Scribe. 02/08/2015. 11:34 AM.    Chief Complaint  Patient presents with  . Hand Pain    The history is provided by the patient. No language interpreter was used.    HPI Comments: Matthew Valentine is a 30 y.o. male who presents to the Emergency Department complaining of sudden onset, constant, moderate, right hand pain for 1 month but has begun to gradually worsen over the past 2 days. Pt endorses the pain is a 7/10 in severity. He states the pain radiates from his fingers into his wrist. He reports associated intermittent tingling. 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 He also adds that 2 weeks ago he hit his right hand on the corner of a countertop. Pt reports the pain is exacerbated with attempting to make a fist and is worse in the morning. He has been taking 800mg  Ibuprofen with no significant relief. He denies any prior injury to the hand. He denies any fever, chills, nausea, vomiting, swelling, redness, or numbness.   Past Medical History  Diagnosis Date  . Back pain    History reviewed. No pertinent past surgical history. No family history on file. Social History  Substance Use Topics  . Smoking status: Never Smoker   . Smokeless tobacco: None  . Alcohol Use: Yes    Review of Systems  Constitutional: Negative for fever and chills.  Gastrointestinal: Negative for nausea and vomiting.  Musculoskeletal: Positive for arthralgias. Negative for joint swelling.  Skin: Negative for color change.  A complete 10 system review of systems was obtained and all systems are negative except as noted in the HPI and PMH.   Allergies  Review of patient's allergies indicates no known  allergies.  Home Medications   Prior to Admission medications   Medication Sig Start Date End Date Taking? Authorizing Provider  cephALEXin (KEFLEX) 500 MG capsule Take 1 capsule (500 mg total) by mouth 4 (four) times daily. 08/16/13   Tatyana Kirichenko, PA-C  oxyCODONE-acetaminophen (PERCOCET) 5-325 MG per tablet Take 1 tablet by mouth every 4 (four) hours as needed for severe pain. 08/16/13   Tatyana Kirichenko, PA-C  sulfamethoxazole-trimethoprim (BACTRIM DS) 800-160 MG per tablet Take 1 tablet by mouth 2 (two) times daily. 09/21/13   Graylon Good, PA-C   Triage Vitals: BP 107/68 mmHg  Pulse 65  Temp(Src) 98.3 F (36.8 C) (Oral)  Resp 20  Ht 5\' 11"  (1.803 m)  Wt 159 lb 2 oz (72.179 kg)  BMI 22.20 kg/m2  SpO2 96%  Physical Exam  Constitutional: He is oriented to person, place, and time. He appears well-developed and well-nourished. No distress.  HENT:  Head: Normocephalic and atraumatic.  Eyes: Conjunctivae and EOM are normal.  Neck: Neck supple. No tracheal deviation present.  Cardiovascular: Normal rate.   Pulmonary/Chest: Effort normal. No respiratory distress.  Musculoskeletal: Normal range of motion. He exhibits tenderness.  Pain with palpation on 2nd, 3rd and 4th MCP, pain with gripping Negative Tenels and Phalens  Neurological: He is alert and oriented to person, place, and time.  Skin: Skin is warm and dry.  Psychiatric: He has a normal mood and affect. His behavior is normal.  Nursing note and vitals  reviewed. Right Upper Extremity: - No atrophy - Skin: No abrasions, no lacerations, no ecchymosis - Motor: Full ROM wrist; 5/5 wrist flexion/extension, thumb, IP joint flexion/extension (AIN/PIN), abduction/adduction (ulnar)   - Sensation intact to median/ulnar/radial nerves - 2+ radial pulse, <2 sec cap refill x 5 digits  ED Course  Procedures (including critical care time)  DIAGNOSTIC STUDIES: Oxygen Saturation is 96% on RA, normal by my interpretation.     COORDINATION OF CARE:  11:39 AM- Will order imaging of right hand. Pt advised of plan for treatment and pt agrees. Labs Review Labs Reviewed - No data to display  Imaging Review No results found. I have personally reviewed and evaluated these images as part of my medical decision-making.   EKG Interpretation None      MDM  I have reviewed relevant imaging studies. I have reviewed the relevant previous healthcare records.I obtained HPI from historian.  ED Course:  Assessment: 89y M with no sig pmh presents with R hand pain. Notes there was trauma to the extremity 2 weeks ago when he hit is hand against the edge of a table. No abrasions, lacerations or ecchymosis noted. He has full ROM of wrist, thumb, IP joints. Sensation intact. Pulses intact with cap refill <2sec. Neg phalens and tinels. Most likely contusion from previous trauma. Will have pt follow up with PCP for further evaluation. Given ace wrap, instructed to use NSAIDs.    Disposition/Plan:  DC Home Additional Verbal discharge instructions given and discussed with patient.  Pt Instructed to f/u with PCP in the next 48 hours for evaluation and treatment of symptoms. Return precautions given Pt acknowledges and agrees with plan   Supervising Physician Melene Planan Floyd, DO   Final diagnoses:  Hand contusion, right, initial encounter    I personally performed the services described in this documentation, which was scribed in my presence. The recorded information has been reviewed and is accurate.     Audry Piliyler Kelsen Celona, PA-C 02/08/15 1314  Melene Planan Floyd, DO 02/08/15 1339

## 2015-02-08 NOTE — ED Notes (Signed)
Declined W/C at D/C and was escorted to lobby by RN. 

## 2015-03-28 ENCOUNTER — Emergency Department (HOSPITAL_COMMUNITY)
Admission: EM | Admit: 2015-03-28 | Discharge: 2015-03-28 | Disposition: A | Discharge status: Home / Self Care | Payer: No Typology Code available for payment source | Attending: Emergency Medicine | Admitting: Emergency Medicine

## 2015-03-28 ENCOUNTER — Encounter (HOSPITAL_COMMUNITY): Payer: Self-pay | Admitting: Emergency Medicine

## 2015-03-28 DIAGNOSIS — H578 Other specified disorders of eye and adnexa: Secondary | ICD-10-CM | POA: Insufficient documentation

## 2015-03-28 DIAGNOSIS — H5713 Ocular pain, bilateral: Secondary | ICD-10-CM | POA: Insufficient documentation

## 2015-03-28 DIAGNOSIS — Z792 Long term (current) use of antibiotics: Secondary | ICD-10-CM | POA: Insufficient documentation

## 2015-03-28 MED ORDER — POLYMYXIN B-TRIMETHOPRIM 10000-0.1 UNIT/ML-% OP SOLN: 1.0000 [drp] | OPHTHALMIC | Status: DC

## 2015-03-28 MED ORDER — KETOROLAC TROMETHAMINE 0.5 % OP SOLN
1.0000 [drp] | Freq: Four times a day (QID) | OPHTHALMIC | Status: DC
Start: 2015-03-28 — End: 2018-03-18

## 2015-03-28 NOTE — Discharge Instructions (Signed)
Use drops as directed. Recommend to keep hands washed, wash pillow case after use. Follow-up with Dr. Sherryll Burger (eye doctor) if no improvement in the next few days. Return here for new concerns.

## 2015-03-28 NOTE — ED Provider Notes (Signed)
CSN: [id number]     Arrival date & time 03/28/15  1920 History  By signing my name below, I, Matthew Valentine, attest that this documentation has been prepared under the direction and in the presence of non-physician practitioner, Sharilyn Sites, PA-C. Electronically Signed: Freida Valentine, Scribe. 03/28/2015. 7:52 PM.    Chief Complaint  Patient presents with  . Eye Pain    The history is provided by the patient. No language interpreter was used.    HPI Comments:  Matthew Valentine is a 30 y.o. male who presents to the Emergency Department complaining of bilateral burning eye pain x 1 week with associated redness and mild drainage.  He states he has to clean his eyes several times a day because of the "goop" in his eye that makes it hard to open his eyes.  He denies recent injury to his eyes.  No chemical exposure. No recent sick contacts with similar symptoms. He also denies use of contact lenses and corrective lenses. No alleviating factors noted. Pt has no other complaints or symptoms at this time.  Patient does have seasonal allergies.  No intervention tried PTA.  VSS.   Past Medical History  Diagnosis Date  . Back pain    History reviewed. No pertinent past surgical history. History reviewed. No pertinent family history. Social History  Substance Use Topics  . Smoking status: Never Smoker   . Smokeless tobacco: None  . Alcohol Use: Yes     Comment: a beer day    Review of Systems  Constitutional: Negative for fever and chills.  Eyes: Positive for pain, discharge and redness. Negative for visual disturbance.  Respiratory: Negative for shortness of breath.   Cardiovascular: Negative for chest pain.  All other systems reviewed and are negative.  Allergies  Review of patient's allergies indicates no known allergies.  Home Medications   Prior to Admission medications   Medication Sig Start Date End Date Taking? Authorizing Provider  cephALEXin (KEFLEX) 500 MG capsule Take 1  capsule (500 mg total) by mouth 4 (four) times daily. 08/16/13   Tatyana Kirichenko, PA-C  oxyCODONE-acetaminophen (PERCOCET) 5-325 MG per tablet Take 1 tablet by mouth every 4 (four) hours as needed for severe pain. 08/16/13   Tatyana Kirichenko, PA-C  sulfamethoxazole-trimethoprim (BACTRIM DS) 800-160 MG per tablet Take 1 tablet by mouth 2 (two) times daily. 09/21/13   Adrian Blackwater Baker, PA-C   BP 130/70 mmHg  Pulse 76  Temp(Src) 98.1 F (36.7 C) (Oral)  Resp 16  Ht  (1.778 m)  Wt 170 lb (77.111 kg)  BMI 24.39 kg/m2  SpO2 97%   Physical Exam  Constitutional: He is oriented to person, place, and time. He appears well-developed and well-nourished.  HENT:  Head: Normocephalic and atraumatic.  Mouth/Throat: Oropharynx is clear and moist.  Eyes: EOM and lids are normal. Pupils are equal, round, and reactive to light. Right conjunctiva is injected. Right conjunctiva has no hemorrhage. Left conjunctiva is injected. Left conjunctiva has no hemorrhage.  No lid edema or erythema; bilateral conjunctiva injected and tearing; no purulent drainage currently; no FB noted; EOMs fully intact and non-painful; pupils symmetric and reactive bilaterally Visual Acuity - Bilateral Distance: 20/20 ; R Distance: 20/30 ; L Distance: 20/30  Neck: Normal range of motion.  Cardiovascular: Normal rate, regular rhythm and normal heart sounds.   Pulmonary/Chest: Effort normal and breath sounds normal.  Abdominal: Soft. Bowel sounds are normal.  Musculoskeletal: Normal range of motion.  Neurological: He is alert and  oriented to person, place, and time.  Skin: Skin is warm and dry.  Psychiatric: He has a normal mood and affect.  Nursing note and vitals reviewed.   ED Course  Procedures   DIAGNOSTIC STUDIES:  Oxygen Saturation is 97% on room air, normal by my interpretation.    COORDINATION OF CARE:  7:51 PM Will discharge with eye drops and opthalmology referral. Discussed treatment plan with pt at  bedside and pt agreed to plan.   MDM   Final diagnoses:  Eye pain, bilateral   30 year old male here with bilateral eye pain and redness. He reports yellow drainage from eyes adamantly throughout the day. Patient is afebrile, nontoxic. His vision is overall normal, he denies visual disturbance. His conjunctiva are injected and eyes are tearing. He has no purulent drainage currently. EOMs fully intact and nonpainful, pupils symmetric. Patient does admit to seasonal allergies which may be contributing to his symptoms, however given report of purulent drainage, feel he needs coverage with antibiotic drops for potential conjunctivitis.  Rx polytrim, acular.  Given ophthalmology follow-up if no improvement in the next few days.  Discussed plan with patient, he/she acknowledged understanding and agreed with plan of care.  Return precautions given for new or worsening symptoms.  I personally performed the services described in this documentation, which was scribed in my presence. The recorded information has been reviewed and is accurate.  Garlon Hatchet, PA-C 03/28/15 2013  Doug Sou, MD 03/29/15 [phone]

## 2015-03-28 NOTE — ED Notes (Signed)
Patient presents to Ed for itching, burning and redness to bilateral eyes.  Denies any exposure to chemicals or injury.  Pt sts he has had yellow discharge from the left eye

## 2015-03-28 NOTE — ED Notes (Signed)
Patient able to ambulate independently  

## 2015-04-21 ENCOUNTER — Encounter (HOSPITAL_COMMUNITY): Payer: Self-pay | Admitting: *Deleted

## 2015-04-21 ENCOUNTER — Emergency Department (HOSPITAL_COMMUNITY)
Admission: EM | Admit: 2015-04-21 | Discharge: 2015-04-21 | Disposition: A | Discharge status: Home / Self Care | Payer: No Typology Code available for payment source | Attending: Emergency Medicine | Admitting: Emergency Medicine

## 2015-04-21 DIAGNOSIS — Z79899 Other long term (current) drug therapy: Secondary | ICD-10-CM | POA: Insufficient documentation

## 2015-04-21 DIAGNOSIS — M5442 Lumbago with sciatica, left side: Secondary | ICD-10-CM | POA: Insufficient documentation

## 2015-04-21 MED ORDER — METHOCARBAMOL 500 MG PO TABS: 500.0000 mg | ORAL_TABLET | Freq: Two times a day (BID) | ORAL | Status: DC

## 2015-04-21 MED ORDER — IBUPROFEN 600 MG PO TABS: 600.0000 mg | ORAL_TABLET | Freq: Four times a day (QID) | ORAL | Status: DC | PRN

## 2015-04-21 NOTE — Discharge Instructions (Signed)
Mr. Tillman Abidesaiah R Samford,  Nice meeting you! Please follow-up with your primary care provider. Return to the emergency department if you develop fevers, chills, lose control of your bladder/bowel. Feel better soon!  S. Lane HackerNicole Havilah Topor, PA-C

## 2015-04-21 NOTE — ED Notes (Signed)
Pt reports he woke up at 0300 with tail bone pain. Pt denies injury.

## 2015-04-21 NOTE — ED Provider Notes (Signed)
CSN: [id number]     Arrival date & time 04/21/15  [id number] History  By signing my name below, I, Placido SouLogan Joldersma, attest that this documentation has been prepared under the direction and in the presence of Melton KrebsSamantha Nicole Aeson Sawyers PA-C. Electronically Signed: Placido SouLogan Joldersma, ED Scribe. 04/21/2015. 9:23 AM.   Chief Complaint  Patient presents with  . Tailbone Pain   The history is provided by the patient. No language interpreter was used.   HPI Comments: Matthew Valentine is a 30 y.o. male with a PMHx of back pain who presents to the Emergency Department complaining of constant, mild, non-radiating, tailbone pain onset 6 hours ago. Pt denies any injury or a hx of similar symptoms and reports noticing his current pain last night while getting up to use the restroom. His pain worsens with palpation. He reports taking 800 mg ibuprofen without significant relief. Pt denies any other associated symptoms at this time.    Past Medical History  Diagnosis Date  . Back pain    No past surgical history on file. No family history on file. Social History  Substance Use Topics  . Smoking status: Never Smoker   . Smokeless tobacco: Not on file  . Alcohol Use: Yes     Comment: a beer day    Review of Systems A complete 10 system review of systems was obtained and all systems are negative except as noted in the HPI and PMH.    Allergies  Review of patient's allergies indicates no known allergies.  Home Medications   Prior to Admission medications   Medication Sig Start Date End Date Taking? Authorizing Provider  cephALEXin (KEFLEX) 500 MG capsule Take 1 capsule (500 mg total) by mouth 4 (four) times daily. 08/16/13   Tatyana Kirichenko, PA-C  ketorolac (ACULAR) 0.5 % ophthalmic solution Place 1 drop into both eyes every 6 (six) hours. 03/28/15   Garlon HatchetLisa M Sanders, PA-C  oxyCODONE-acetaminophen (PERCOCET) 5-325 MG per tablet Take 1 tablet by mouth every 4 (four) hours as needed for severe pain. 08/16/13    Tatyana Kirichenko, PA-C  sulfamethoxazole-trimethoprim (BACTRIM DS) 800-160 MG per tablet Take 1 tablet by mouth 2 (two) times daily. 09/21/13   Graylon GoodZachary H Baker, PA-C  trimethoprim-polymyxin b (POLYTRIM) ophthalmic solution Place 1 drop into both eyes every 4 (four) hours. 03/28/15   Garlon HatchetLisa M Sanders, PA-C   BP 108/58 mmHg  Pulse 58  Temp(Src) 97.4 F (36.3 C) (Oral)  Resp 18  Wt 160 lb 3 oz (72.661 kg)  SpO2 100%    Physical Exam  Constitutional: He is oriented to person, place, and time. He appears well-developed and well-nourished.  HENT:  Head: Normocephalic and atraumatic.  Eyes: EOM are normal.  Neck: Normal range of motion.  Cardiovascular: Normal rate.   Pulmonary/Chest: Effort normal. No respiratory distress.  Abdominal: Soft.  Musculoskeletal: Normal range of motion. He exhibits tenderness. He exhibits no edema.  Left paraspinal tenderness; left SI tenderness; no erythema, drainage or swelling; no step offs or deformities  Neurological: He is alert and oriented to person, place, and time.  Skin: Skin is warm and dry.  Psychiatric: He has a normal mood and affect.  Nursing note and vitals reviewed.    ED Course  Procedures  DIAGNOSTIC STUDIES: Oxygen Saturation is 97% on RA, normal by my interpretation.    COORDINATION OF CARE: 9:22 AM Discussed next steps with pt. Return precautions noted. He verbalized understanding and is agreeable with the plan.   MDM  Final diagnoses:  Left-sided low back pain with left-sided sciatica   Patient with back pain.  No neurological deficits and normal neuro exam.  Patient can walk but states is painful.  No loss of bowel or bladder control.  No concern for cauda equina.  No fever, night sweats, weight loss, h/o cancer, IVDU.  RICE protocol and pain medicine indicated and discussed with patient.   I personally performed the services described in this documentation, which was scribed in my presence. The recorded information has been  reviewed and is accurate.   Melton Krebs, PA-C 04/24/15 1610  Rolan Bucco, MD 04/28/15 [phone]

## 2015-04-21 NOTE — ED Notes (Signed)
Declined W/C at D/C and was escorted to lobby by RN. 

## 2016-09-23 ENCOUNTER — Emergency Department (HOSPITAL_COMMUNITY)
Admission: EM | Admit: 2016-09-23 | Discharge: 2016-09-23 | Disposition: A | Discharge status: Home / Self Care | Payer: Self-pay | Attending: Emergency Medicine | Admitting: Emergency Medicine

## 2016-09-23 ENCOUNTER — Encounter (HOSPITAL_COMMUNITY): Payer: Self-pay | Admitting: Emergency Medicine

## 2016-09-23 ENCOUNTER — Emergency Department (HOSPITAL_COMMUNITY): Payer: Self-pay

## 2016-09-23 DIAGNOSIS — Z79899 Other long term (current) drug therapy: Secondary | ICD-10-CM | POA: Insufficient documentation

## 2016-09-23 DIAGNOSIS — M542 Cervicalgia: Secondary | ICD-10-CM | POA: Insufficient documentation

## 2016-09-23 DIAGNOSIS — M62838 Other muscle spasm: Secondary | ICD-10-CM | POA: Insufficient documentation

## 2016-09-23 MED ORDER — CYCLOBENZAPRINE HCL 10 MG PO TABS: 10.0000 mg | ORAL_TABLET | Freq: Two times a day (BID) | ORAL | 0 refills | Status: DC | PRN

## 2016-09-23 MED ORDER — NAPROXEN 375 MG PO TABS: 375.0000 mg | ORAL_TABLET | Freq: Two times a day (BID) | ORAL | 0 refills | Status: DC

## 2016-09-23 NOTE — ED Notes (Signed)
ED Provider at bedside. 

## 2016-09-23 NOTE — ED Provider Notes (Signed)
[facility]-EMERGENCY DEPT Provider Note   CSN: [id number] Arrival date & time: 09/23/16  1743     History   Chief Complaint Chief Complaint  Patient presents with  . Neck Pain    HPI Matthew Valentine is a 31 y.o. male.  Patient reporting episodes of intermittent neck pain for several months. Exacerbation of pain upon awakening this morning. No history of prior injury. Took excedrin this morning with mild relief.   The history is provided by the patient. No language interpreter was used.  Neck Injury  This is a recurrent problem. The current episode started 12 to 24 hours ago. The problem occurs daily. The problem has been gradually worsening. Pertinent negatives include no headaches and no shortness of breath. He has tried acetaminophen for the symptoms. The treatment provided mild relief.    Past Medical History:  Diagnosis Date  . Back pain     There are no active problems to display for this patient.   History reviewed. No pertinent surgical history.     Home Medications    Prior to Admission medications   Medication Sig Start Date End Date Taking? Authorizing Provider  cephALEXin (KEFLEX) 500 MG capsule Take 1 capsule (500 mg total) by mouth 4 (four) times daily. 08/16/13   Kirichenko, Tatyana, PA-C  ibuprofen (ADVIL,MOTRIN) 600 MG tablet Take 1 tablet (600 mg total) by mouth every 6 (six) hours as needed. 04/21/15   Melton Krebs, PA-C  ketorolac (ACULAR) 0.5 % ophthalmic solution Place 1 drop into both eyes every 6 (six) hours. 03/28/15   Garlon Hatchet, PA-C  methocarbamol (ROBAXIN) 500 MG tablet Take 1 tablet (500 mg total) by mouth 2 (two) times daily. 04/21/15   Melton Krebs, PA-C  oxyCODONE-acetaminophen (PERCOCET) 5-325 MG per tablet Take 1 tablet by mouth every 4 (four) hours as needed for severe pain. 08/16/13   Kirichenko, Lemont Fillers, PA-C  sulfamethoxazole-trimethoprim (BACTRIM DS) 800-160 MG per tablet Take 1 tablet by mouth 2 (two) times daily.  09/21/13   Graylon Good, PA-C  trimethoprim-polymyxin b (POLYTRIM) ophthalmic solution Place 1 drop into both eyes every 4 (four) hours. 03/28/15   Garlon Hatchet, PA-C    Family History No family history on file.  Social History Social History  Substance Use Topics  . Smoking status: Never Smoker  . Smokeless tobacco: Never Used  . Alcohol use Yes     Comment: a beer day     Allergies   Patient has no known allergies.   Review of Systems Review of Systems  Respiratory: Negative for shortness of breath.   Musculoskeletal: Positive for neck pain and neck stiffness.  Neurological: Negative for headaches.  All other systems reviewed and are negative.    Physical Exam Updated Vital Signs BP 125/81 (BP Location: Right Arm)   Pulse 65   Temp 98.1 F (36.7 C) (Oral)   Resp 16   Ht 5\' 10"  (1.778 m)   Wt 74.8 kg (165 lb)   SpO2 98%   BMI 23.68 kg/m   Physical Exam  Constitutional: He is oriented to person, place, and time. He appears well-developed and well-nourished.  HENT:  Head: Normocephalic.  Eyes: Conjunctivae are normal.  Neck: Neck supple. Muscular tenderness present. No spinous process tenderness present.  Cardiovascular: Normal rate and regular rhythm.   Pulmonary/Chest: Effort normal and breath sounds normal.  Abdominal: Soft. Bowel sounds are normal.  Musculoskeletal: Normal range of motion.  Neurological: He is alert and oriented to person,  place, and time. He has normal strength. No sensory deficit.  Skin: Skin is warm and dry. No rash noted.  Nursing note and vitals reviewed.    ED Treatments / Results  Labs (all labs ordered are listed, but only abnormal results are displayed) Labs Reviewed - No data to display  EKG  EKG Interpretation None       Radiology Dg Cervical Spine Complete  Result Date: 09/23/2016 CLINICAL DATA:  Posterior neck and shoulder pain. EXAM: CERVICAL SPINE - COMPLETE 4+ VIEW COMPARISON:  11/26/2012 FINDINGS: No  fracture, bone lesion or spondylolisthesis. Reversal the normal cervical lordosis appears to be due to neck flexion. Disc spaces and neural foramina are well maintained. No degenerative changes. Soft tissues are unremarkable. IMPRESSION: Negative cervical spine radiographs. Electronically Signed   By: Amie Portland M.D.   On: 09/23/2016 20:29    Procedures Procedures (including critical care time)  Medications Ordered in ED Medications - No data to display   Initial Impression / Assessment and Plan / ED Course  I have reviewed the triage vital signs and the nursing notes.  Pertinent labs & imaging results that were available during my care of the patient were reviewed by me and considered in my medical decision making (see chart for details).    Patient with recurrent neck pain. Mild mid-line tenderness, but primarily paraspinous/muscular involvement. No neurologic deficits. No fever or nuchal rigidity. Normal cervical spine radiographs.  Will treat with muscle relaxant and anti-inflammatory. Return precautions discussed.  Final Clinical Impressions(s) / ED Diagnoses   Final diagnoses:  Neck pain  Muscle spasms of neck    New Prescriptions Discharge Medication List as of 09/23/2016  8:37 PM    START taking these medications   Details  cyclobenzaprine (FLEXERIL) 10 MG tablet Take 1 tablet (10 mg total) by mouth 2 (two) times daily as needed for muscle spasms., Starting Tue 09/23/2016, Print    naproxen (NAPROSYN) 375 MG tablet Take 1 tablet (375 mg total) by mouth 2 (two) times daily., Starting Tue 09/23/2016, Print         Felicie Morn, NP 09/23/16 2139    Pricilla Loveless, MD 09/24/16 [id number]

## 2016-09-23 NOTE — ED Triage Notes (Signed)
Pt. Stated, I woke this morning with terrible neck pain, it hurts awfully.

## 2016-10-01 ENCOUNTER — Emergency Department (HOSPITAL_COMMUNITY): Payer: Self-pay

## 2016-10-01 ENCOUNTER — Emergency Department (HOSPITAL_COMMUNITY)
Admission: EM | Admit: 2016-10-01 | Discharge: 2016-10-01 | Disposition: A | Discharge status: Home / Self Care | Payer: Self-pay | Attending: Emergency Medicine | Admitting: Emergency Medicine

## 2016-10-01 ENCOUNTER — Encounter (HOSPITAL_COMMUNITY): Payer: Self-pay | Admitting: Emergency Medicine

## 2016-10-01 DIAGNOSIS — M436 Torticollis: Secondary | ICD-10-CM | POA: Insufficient documentation

## 2016-10-01 LAB — BASIC METABOLIC PANEL
ANION GAP: 7 (ref 5–15)
BUN: 13 mg/dL (ref 6–20)
CALCIUM: 9.8 mg/dL (ref 8.9–10.3)
CO2: 26 mmol/L (ref 22–32)
Chloride: 105 mmol/L (ref 101–111)
Creatinine, Ser: 0.86 mg/dL (ref 0.61–1.24)
GFR calc non Af Amer: >60 mL/min (ref >60)
Glucose, Bld: 92 mg/dL (ref 65–99)
Potassium: 3.9 mmol/L (ref 3.5–5.1)
Sodium: 138 mmol/L (ref 135–145)

## 2016-10-01 LAB — CBC WITH DIFFERENTIAL/PLATELET
BASOS ABS: 0 10*3/uL (ref 0.0–0.1)
Basophils Relative: 0 %
EOS ABS: 0.3 10*3/uL (ref 0.0–0.7)
EOS PCT: 4 %
HCT: 43.9 % (ref 39.0–52.0)
Hemoglobin: 15.1 g/dL (ref 13.0–17.0)
Lymphocytes Relative: 27 %
Lymphs Abs: 2.3 10*3/uL (ref 0.7–4.0)
MCH: 30.3 pg (ref 26.0–34.0)
MCHC: 34.4 g/dL (ref 30.0–36.0)
MCV: 88 fL (ref 78.0–100.0)
MONO ABS: 0.7 10*3/uL (ref 0.1–1.0)
MONOS PCT: 9 %
Neutro Abs: 5.2 10*3/uL (ref 1.7–7.7)
Neutrophils Relative %: 60 %
PLATELETS: 247 10*3/uL (ref 150–400)
RBC: 4.99 MIL/uL (ref 4.22–5.81)
RDW: 13.2 % (ref 11.5–15.5)
WBC: 8.6 10*3/uL (ref 4.0–10.5)

## 2016-10-01 MED ORDER — DIAZEPAM 5 MG PO TABS: 5.0000 mg | ORAL_TABLET | Freq: Three times a day (TID) | ORAL | 0 refills | Status: DC | PRN

## 2016-10-01 MED ORDER — MORPHINE SULFATE (PF) 4 MG/ML IV SOLN
4.0000 mg | Freq: Once | INTRAVENOUS | Status: AC
  Administered 2016-10-01: 4 mg via INTRAVENOUS
  Filled 2016-10-01: qty 1

## 2016-10-01 MED ORDER — NAPROXEN 500 MG PO TABS: 500.0000 mg | ORAL_TABLET | Freq: Two times a day (BID) | ORAL | 0 refills | Status: AC

## 2016-10-01 MED ORDER — METHOCARBAMOL 1000 MG/10ML IJ SOLN
500.0000 mg | Freq: Once | INTRAMUSCULAR | Status: AC
  Administered 2016-10-01: 500 mg via INTRAMUSCULAR
  Filled 2016-10-01: qty 5

## 2016-10-01 MED ORDER — DIPHENHYDRAMINE HCL 50 MG/ML IJ SOLN
25.0000 mg | Freq: Once | INTRAMUSCULAR | Status: AC
  Administered 2016-10-01: 25 mg via INTRAVENOUS
  Filled 2016-10-01: qty 1

## 2016-10-01 MED ORDER — NAPROXEN 500 MG PO TABS: 375.0000 mg | ORAL_TABLET | Freq: Two times a day (BID) | ORAL | 0 refills | Status: DC

## 2016-10-01 MED ORDER — IOPAMIDOL (ISOVUE-300) INJECTION 61%
INTRAVENOUS | Status: AC
  Filled 2016-10-01: qty 75

## 2016-10-01 MED ORDER — MIDAZOLAM HCL 2 MG/2ML IJ SOLN
2.0000 mg | Freq: Once | INTRAMUSCULAR | Status: AC
  Administered 2016-10-01: 2 mg via INTRAVENOUS
  Filled 2016-10-01: qty 2

## 2016-10-01 MED ORDER — KETOROLAC TROMETHAMINE 15 MG/ML IJ SOLN
15.0000 mg | Freq: Once | INTRAMUSCULAR | Status: AC
  Administered 2016-10-01: 15 mg via INTRAVENOUS
  Filled 2016-10-01: qty 1

## 2016-10-01 MED ORDER — IOPAMIDOL (ISOVUE-300) INJECTION 61%
75.0000 mL | Freq: Once | INTRAVENOUS | Status: AC | PRN
  Administered 2016-10-01: 75 mL via INTRAVENOUS

## 2016-10-01 NOTE — ED Notes (Signed)
Bed: WA04 Expected date:  Expected time:  Means of arrival:  Comments: 

## 2016-10-01 NOTE — ED Provider Notes (Signed)
[facility]-EMERGENCY DEPT Provider Note   CSN: [id number] Arrival date & time: 10/01/16  7829     History   Chief Complaint Chief Complaint  Patient presents with  . Headache  . Neck Pain    HPI Matthew Valentine is a 31 y.o. male.  HPI   31 yo M here with neck pain. Pt states his pain began after waking up from sleeping several days ago. He describes it as a cramping, throbbing pain along his right paraspinal neck that is worse with movement and palpation. He was seen in the ED and had neg plain films, was sent home with supportive care. He states this mildly helped but has had persistent pain since then. It seems to worsen when he rotated his head. He has an associated throbbing, aching pain in his neck that occasionally shoots up to his right temporal area. No cough, SOB, or sore throat. No difficulty swallowing. No dental pain. NO fevers or chills. No photophobia, phonophobia. He has had no sick contacts. No rashes.  Past Medical History:  Diagnosis Date  . Back pain     There are no active problems to display for this patient.   History reviewed. No pertinent surgical history.     Home Medications    Prior to Admission medications   Medication Sig Start Date End Date Taking? Authorizing Provider  cyclobenzaprine (FLEXERIL) 10 MG tablet Take 1 tablet (10 mg total) by mouth 2 (two) times daily as needed for muscle spasms. 09/23/16  Yes Felicie Morn, NP  diphenhydramine-acetaminophen (TYLENOL PM) 25-500 MG TABS tablet Take 2 tablets by mouth at bedtime as needed.   Yes [provider]  cephALEXin (KEFLEX) 500 MG capsule Take 1 capsule (500 mg total) by mouth 4 (four) times daily. Patient not taking: Reported on 10/01/2016 08/16/13   Jaynie Crumble, PA-C  diazepam (VALIUM) 5 MG tablet Take 1 tablet (5 mg total) by mouth every 8 (eight) hours as needed for muscle spasms. 10/01/16   Shaune Pollack, MD  ibuprofen (ADVIL,MOTRIN) 600 MG tablet Take 1 tablet (600 mg total)  by mouth every 6 (six) hours as needed. Patient not taking: Reported on 10/01/2016 04/21/15   Melton Krebs, PA-C  ketorolac (ACULAR) 0.5 % ophthalmic solution Place 1 drop into both eyes every 6 (six) hours. Patient not taking: Reported on 10/01/2016 03/28/15   Garlon Hatchet, PA-C  methocarbamol (ROBAXIN) 500 MG tablet Take 1 tablet (500 mg total) by mouth 2 (two) times daily. Patient not taking: Reported on 10/01/2016 04/21/15   Melton Krebs, PA-C  naproxen (NAPROSYN) 500 MG tablet Take 1 tablet (500 mg total) by mouth 2 (two) times daily with a meal. 10/01/16 10/08/16  Shaune Pollack, MD  oxyCODONE-acetaminophen (PERCOCET) 5-325 MG per tablet Take 1 tablet by mouth every 4 (four) hours as needed for severe pain. Patient not taking: Reported on 10/01/2016 08/16/13   Jaynie Crumble, PA-C  sulfamethoxazole-trimethoprim (BACTRIM DS) 800-160 MG per tablet Take 1 tablet by mouth 2 (two) times daily. Patient not taking: Reported on 10/01/2016 09/21/13   Graylon Good, PA-C  trimethoprim-polymyxin b (POLYTRIM) ophthalmic solution Place 1 drop into both eyes every 4 (four) hours. Patient not taking: Reported on 10/01/2016 03/28/15   Garlon Hatchet, PA-C    Family History History reviewed. No pertinent family history.  Social History Social History  Substance Use Topics  . Smoking status: Never Smoker  . Smokeless tobacco: Never Used  . Alcohol use Yes  Comment: a beer day     Allergies   Patient has no known allergies.   Review of Systems Review of Systems  Constitutional: Negative for chills, fatigue and fever.  HENT: Negative for congestion and rhinorrhea.   Eyes: Negative for visual disturbance.  Respiratory: Negative for cough, shortness of breath and wheezing.   Cardiovascular: Negative for chest pain and leg swelling.  Gastrointestinal: Negative for abdominal pain, diarrhea, nausea and vomiting.  Genitourinary: Negative for dysuria and flank pain.  Musculoskeletal:  Positive for neck pain and neck stiffness.  Skin: Negative for rash and wound.  Allergic/Immunologic: Negative for immunocompromised state.  Neurological: Negative for syncope, weakness and headaches.  All other systems reviewed and are negative.    Physical Exam Updated Vital Signs BP 116/67   Pulse 60   Temp 98.2 F (36.8 C) (Oral)   Resp 14   SpO2 100%   Physical Exam  Constitutional: He is oriented to person, place, and time. He appears well-developed and well-nourished. No distress.  HENT:  Head: Normocephalic and atraumatic.  Eyes: Conjunctivae are normal.  Neck: Neck supple.    Significant TTP over right paraspinal cervical muscles, right proximal SCM. No deformity. There is palpable muscle spasm and pt is holding head slightly laterally deviated to the right. Exquisite pain with rotation to the left.  Cardiovascular: Normal rate, regular rhythm and normal heart sounds.  Exam reveals no friction rub.   No murmur heard. Pulmonary/Chest: Effort normal and breath sounds normal. No respiratory distress. He has no wheezes. He has no rales.  Abdominal: He exhibits no distension.  Musculoskeletal: He exhibits no edema.  Neurological: He is alert and oriented to person, place, and time. He exhibits normal muscle tone.  Skin: Skin is warm. Capillary refill takes less than 2 seconds.  Psychiatric: He has a normal mood and affect.  Nursing note and vitals reviewed.    ED Treatments / Results  Labs (all labs ordered are listed, but only abnormal results are displayed) Labs Reviewed  CBC WITH DIFFERENTIAL/PLATELET  BASIC METABOLIC PANEL    EKG  EKG Interpretation None       Radiology Ct Soft Tissue Neck W Contrast  Result Date: 10/01/2016 CLINICAL DATA:  Headache. Neck pain beginning last night. Neck stiffness over the last week. Symptoms worse turning the head towards the right. EXAM: CT NECK WITH CONTRAST TECHNIQUE: Multidetector CT imaging of the neck was performed  using the standard protocol following the bolus administration of intravenous contrast. CONTRAST:  75mL ISOVUE-300 IOPAMIDOL (ISOVUE-300) INJECTION 61% COMPARISON:  Radiography 09/23/2016 FINDINGS: Pharynx and larynx: Normal.  No mucosal or submucosal lesion. Salivary glands: Parotid and submandibular glands are normal. Thyroid: Normal Lymph nodes: No enlarged or low-density nodes on either side of the neck. Normal nodes bilateral. Vascular: No abnormal vascular finding either arterial or venous. Limited intracranial: Normal Visualized orbits: Normal Mastoids and visualized paranasal sinuses: No inflammatory sinus disease. Insignificant retention cyst left division of the sphenoid sinus. Skeleton: No spinal abnormality seen. Straightening of the normal cervical lordosis, likely positional. No evidence of facet arthropathy or degenerative spondylosis. Temporomandibular joints appear unremarkable. The patient does have dental and periodontal disease, particularly at the site of a left mandibular root canal. Upper chest: Normal Other: None IMPRESSION: No cause of the presenting symptoms is identified. No evidence of inflammatory disease. No mass. Cervical spine appears unremarkable. Electronically Signed   By: Paulina Fusi M.D.   On: 10/01/2016 12:55    Procedures Procedures (including critical care time)  Medications Ordered in ED Medications  midazolam (VERSED) injection 2 mg (2 mg Intravenous Given 10/01/16 1122)  ketorolac (TORADOL) 15 MG/ML injection 15 mg (15 mg Intravenous Given 10/01/16 1122)  morphine 4 MG/ML injection 4 mg (4 mg Intravenous Given 10/01/16 1216)  methocarbamol (ROBAXIN) injection 500 mg (500 mg Intramuscular Given 10/01/16 1310)  iopamidol (ISOVUE-300) 61 % injection 75 mL (75 mLs Intravenous Contrast Given 10/01/16 1228)  diphenhydrAMINE (BENADRYL) injection 25 mg (25 mg Intravenous Given 10/01/16 1237)     Initial Impression / Assessment and Plan / ED Course  I have reviewed the triage  vital signs and the nursing notes.  Pertinent labs & imaging results that were available during my care of the patient were reviewed by me and considered in my medical decision making (see chart for details).     31 yo M here with spasm and pain to his right paraspinal cervical muscles, which began after sleeping. Exam is as above. I suspect pt's sx are 2/2 ongoing muscular strain and spasm. His pain is reproducible on exam. However, given his persistent pain despite supportive care, as well as reported h/o neck abscess/infection, CT scan obtained. CT shows no acute abnormality. No deep abscess/infection, PTA, or RPA. He has normal WBC, no other HA, no photophobia or s/s meningitis or encephalitis. He feels markedly improved with valium here and is now able to pROM his neck. Suspect ongoing muscle spasm and torticollis. Will give low-dose valium PRN, continue supportive care, and d/c with outpt follow-up.  Final Clinical Impressions(s) / ED Diagnoses   Final diagnoses:  Torticollis    New Prescriptions Discharge Medication List as of 10/01/2016  2:28 PM       Shaune PollackIsaacs, Shaqueena Mauceri, MD 10/02/16 1109

## 2016-10-01 NOTE — ED Triage Notes (Addendum)
Pt c/o headache, neck pain onset last night. Neck stiffness x 1 week, worsened yesterday after turning his head to the right, pain radiating to right ear, across lower posterior head, and right periorbital area. Pain worse with chewing, ROM of TMJ, ROM of cervical spine. No fevers, chills, nausea, emesis. No injury. [facility], given Flexeril which did not alleviate the pain. Pt sitting stiffly on exam bed, avoiding moving neck, speech is muffled due to patient attempting to move TMJ minimally. Attempted to test Brudzinski's and Kernig's signs, patient neck too stiff and tender to tolerate laying flat.

## 2016-10-01 NOTE — ED Notes (Signed)
Patient transported to CT 

## 2016-10-01 NOTE — Discharge Instructions (Signed)
Take the NAPROXEN regularly, along with the FLEXERIL prescribed to you the other day twice a day  Then, take the VALIUM as needed when these medications do not help. Use this only when the spasms are severe.

## 2018-01-25 IMAGING — CT CT NECK W/ CM
3 of 4 series · 13 of 33 positions shown, 16 images · IV contrast (iopamidol)
Comparison: Radiography 09/23/2016

CLINICAL DATA: Headache. Neck pain beginning last night. Neck
stiffness over the last week. Symptoms worse turning the head
towards the right.

EXAM:
CT NECK WITH CONTRAST
TECHNIQUE: Multidetector CT imaging of the neck was performed using the
standard protocol following the bolus administration of intravenous
contrast.
CONTRAST:  75mL [id number] IOPAMIDOL ([id number]) INJECTION 61%

[Series 4: coronal st · coronal · 0.45mm/px · 3 of 123 slices shown]
[im 33/123  bone]
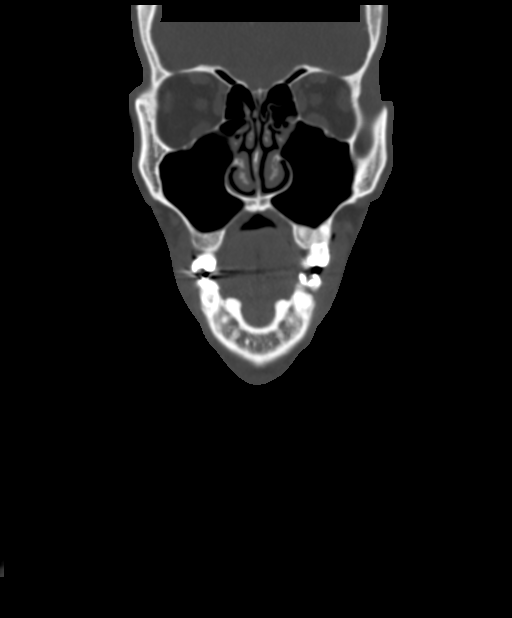
[im 52/123  bone]
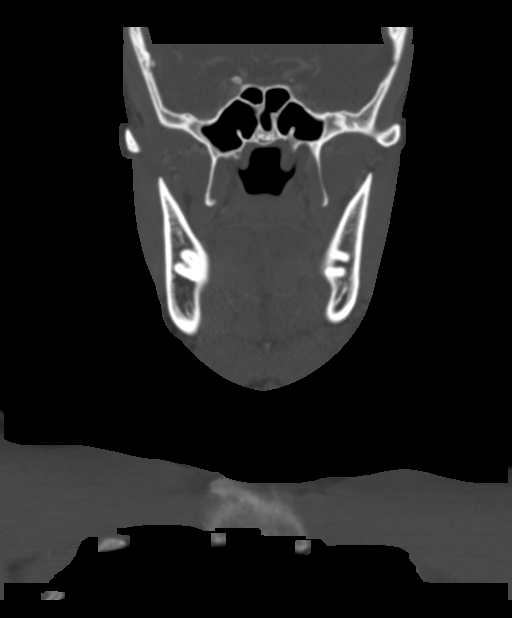
[im 71/123  bone]
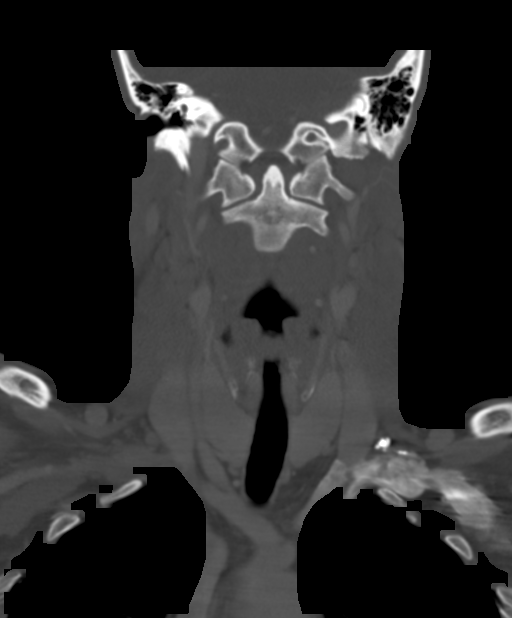

[Series 5: sagittal st · sagittal · 0.52mm/px · 5 of 101 slices shown, 6 images]
[im 34/101  bone]
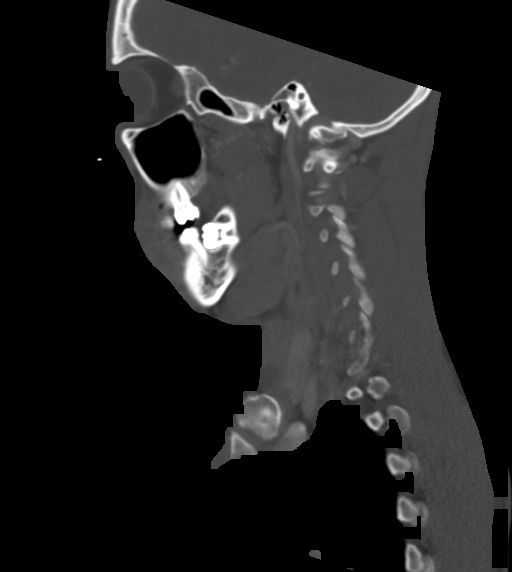
[im 42/101  bone]
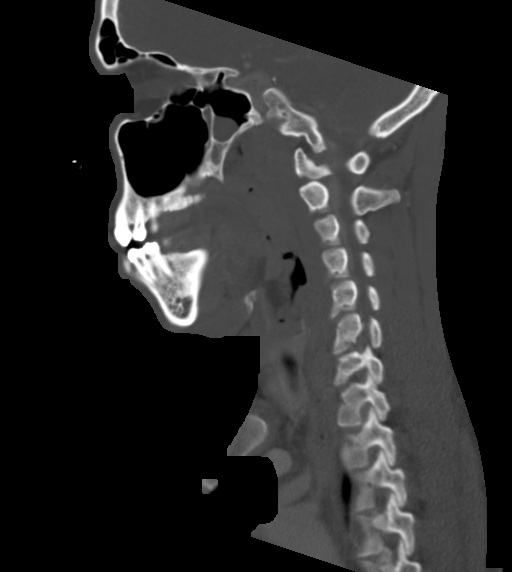
[im 51/101  soft-tissue]
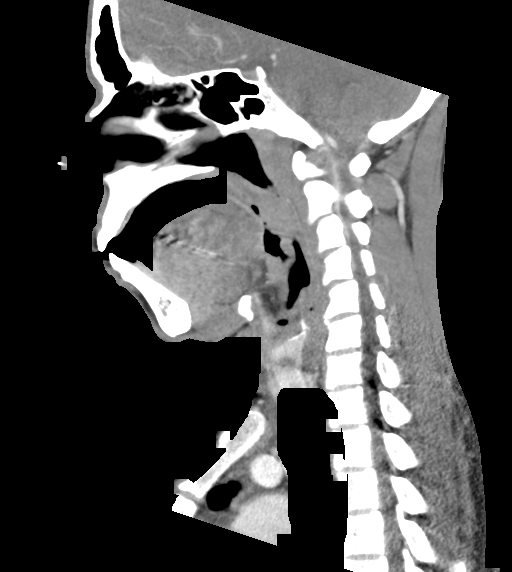
[im 51/101  bone]
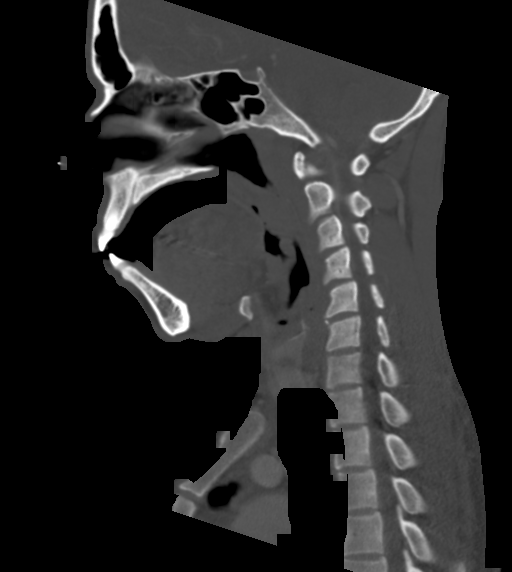
[im 59/101  bone]
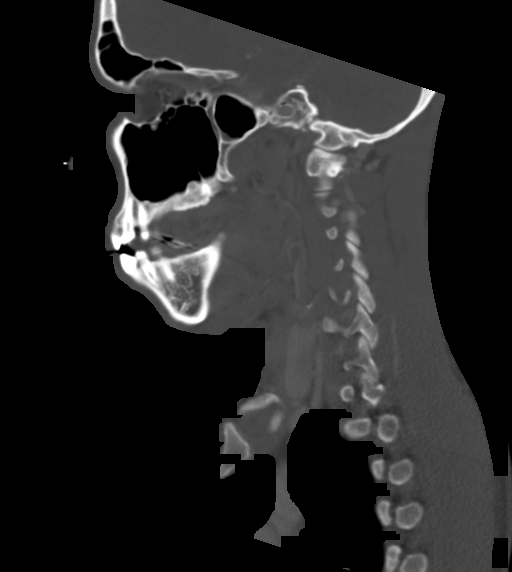
[im 67/101  bone]
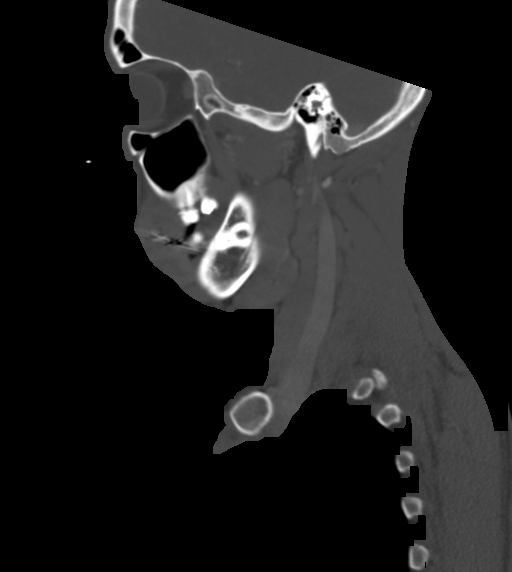

[Series 6: ax axial recons · axial · 0.44mm/px · z∈[+1392,+1567]mm · 5 of 132 slices shown, 7 images]
[im 22/132  soft-tissue]
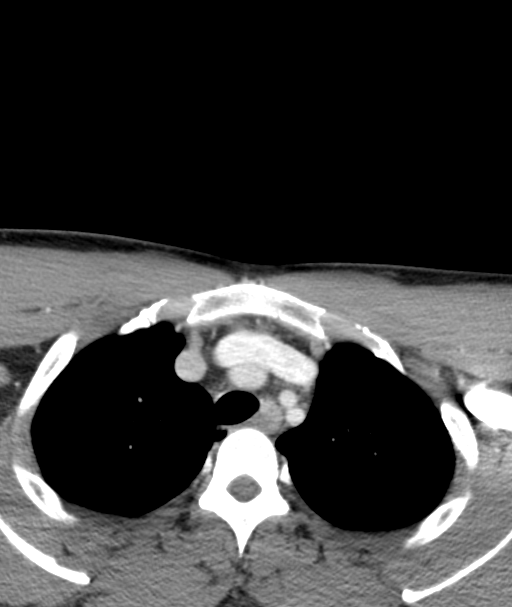
[im 22/132  bone]
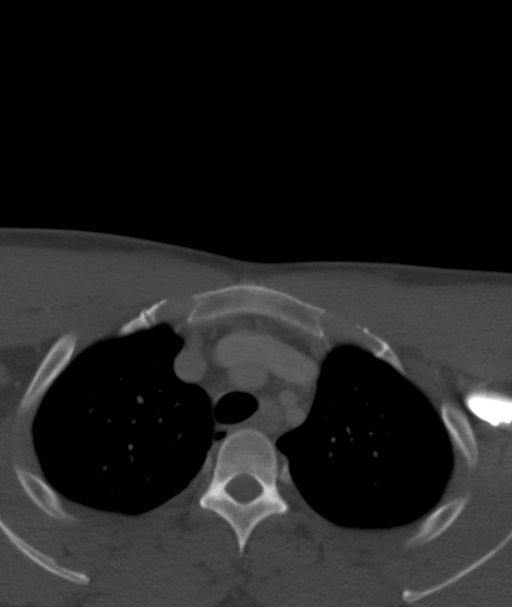
[im 44/132  bone]
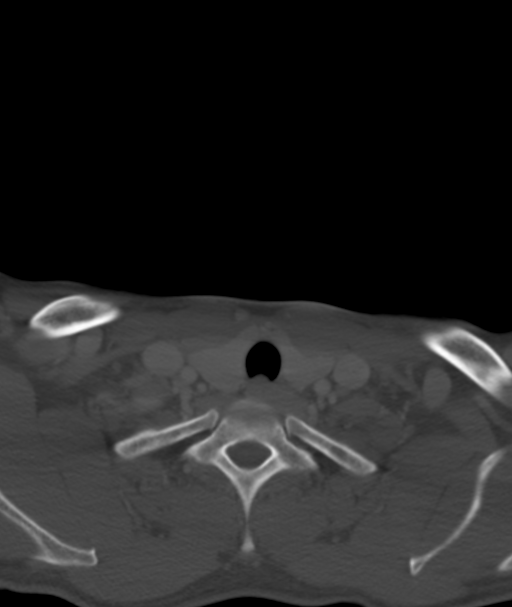
[im 66/132  bone]
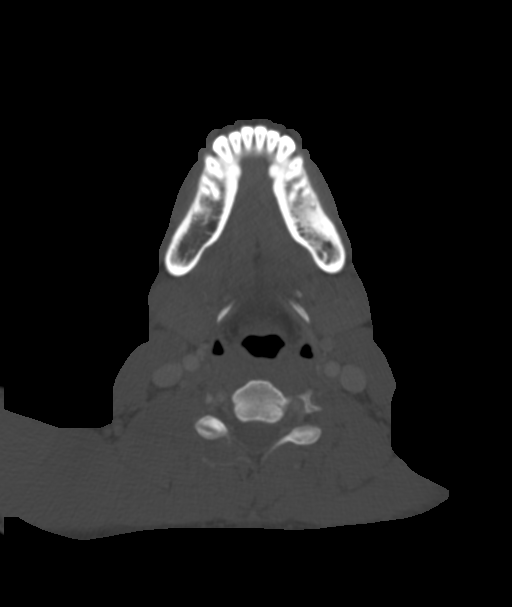
[im 88/132  bone]
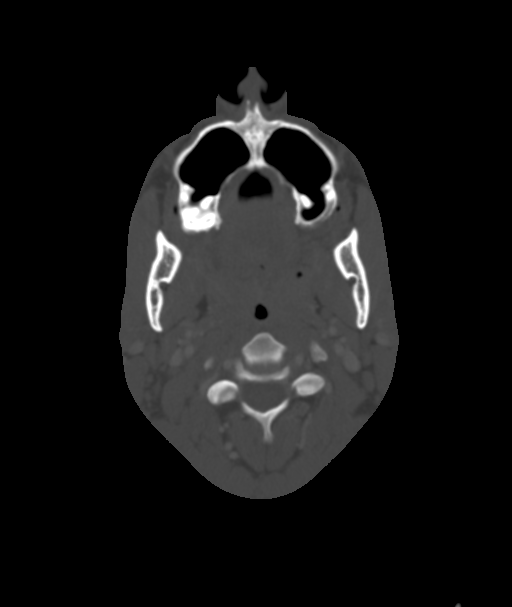
[im 110/132  soft-tissue]
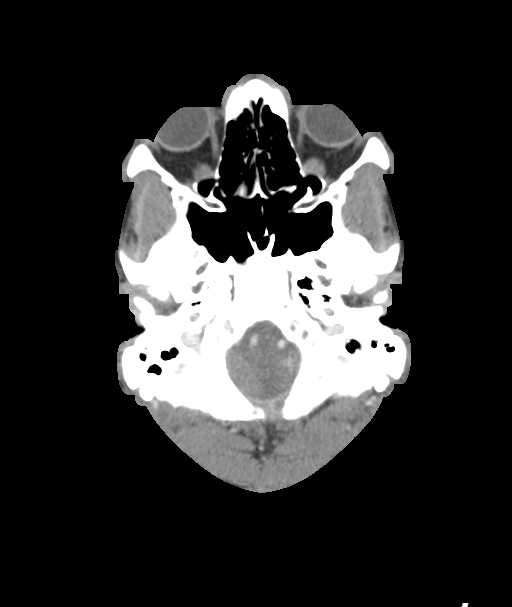
[im 110/132  bone]
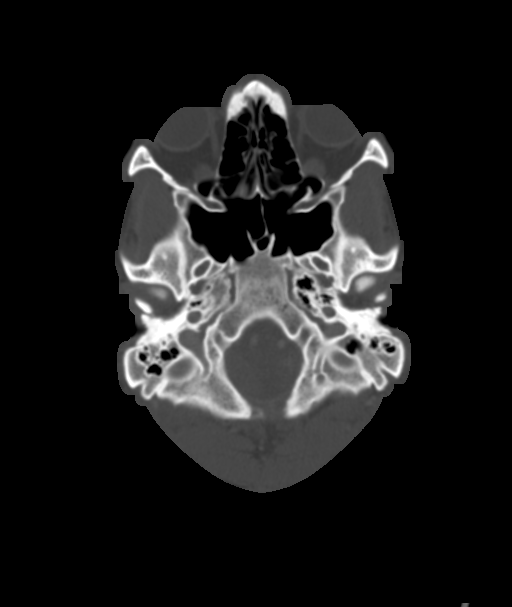

[13 of 33 positions shown; findings below may reference images not displayed]

FINDINGS: Pharynx and larynx: Normal.  No mucosal or submucosal lesion.

Salivary glands: Parotid and submandibular glands are normal.

Thyroid: Normal

Lymph nodes: No enlarged or low-density nodes on either side of the
neck. Normal nodes bilateral.

Vascular: No abnormal vascular finding either arterial or venous.

Limited intracranial: Normal

Visualized orbits: Normal

Mastoids and visualized paranasal sinuses: No inflammatory sinus
disease. Insignificant retention cyst left division of the sphenoid
sinus.

Skeleton: No spinal abnormality seen. Straightening of the normal
cervical lordosis, likely positional. No evidence of facet
arthropathy or degenerative spondylosis. Temporomandibular joints
appear unremarkable. The patient does have dental and periodontal
disease, particularly at the site of a left mandibular root canal.

Upper chest: Normal

Other: None
IMPRESSION: No cause of the presenting symptoms is identified. No evidence of
inflammatory disease. No mass. Cervical spine appears unremarkable.

## 2018-03-18 ENCOUNTER — Emergency Department (HOSPITAL_COMMUNITY)
Admission: EM | Admit: 2018-03-18 | Discharge: 2018-03-18 | Disposition: A | Discharge status: Home / Self Care | Payer: No Typology Code available for payment source | Attending: Emergency Medicine | Admitting: Emergency Medicine

## 2018-03-18 ENCOUNTER — Other Ambulatory Visit: Payer: Self-pay

## 2018-03-18 ENCOUNTER — Encounter (HOSPITAL_COMMUNITY): Payer: Self-pay | Admitting: Emergency Medicine

## 2018-03-18 DIAGNOSIS — M545 Low back pain, unspecified: Secondary | ICD-10-CM

## 2018-03-18 DIAGNOSIS — N390 Urinary tract infection, site not specified: Secondary | ICD-10-CM | POA: Insufficient documentation

## 2018-03-18 LAB — URINALYSIS, ROUTINE W REFLEX MICROSCOPIC
Bilirubin Urine: NEGATIVE
GLUCOSE, UA: NEGATIVE mg/dL
HGB URINE DIPSTICK: NEGATIVE
Ketones, ur: 5 mg/dL — AB
Leukocytes,Ua: NEGATIVE
Nitrite: NEGATIVE
PROTEIN: 30 mg/dL — AB
Specific Gravity, Urine: 1.028 (ref 1.005–1.030)
pH: 5 (ref 5.0–8.0)

## 2018-03-18 LAB — POCT I-STAT EG7
Acid-Base Excess: 1 mmol/L (ref 0.0–2.0)
Bicarbonate: 26.2 mmol/L (ref 20.0–28.0)
CALCIUM ION: 1.16 mmol/L (ref 1.15–1.40)
HCT: 48 % (ref 39.0–52.0)
HEMOGLOBIN: 16.3 g/dL (ref 13.0–17.0)
O2 Saturation: 70 %
PO2 VEN: 38 mmHg (ref 32.0–45.0)
POTASSIUM: 3.9 mmol/L (ref 3.5–5.1)
SODIUM: 137 mmol/L (ref 135–145)
TCO2: 28 mmol/L (ref 22–32)
pCO2, Ven: 44.3 mmHg (ref 44.0–60.0)
pH, Ven: 7.38 (ref 7.250–7.430)

## 2018-03-18 LAB — CBG MONITORING, ED: Glucose-Capillary: 78 mg/dL (ref 70–99)

## 2018-03-18 LAB — I-STAT CREATININE, ED: Creatinine, Ser: 1.1 mg/dL (ref 0.61–1.24)

## 2018-03-18 MED ORDER — DOXYCYCLINE HYCLATE 100 MG PO CAPS: 100.0000 mg | ORAL_CAPSULE | Freq: Two times a day (BID) | ORAL | 0 refills | Status: DC

## 2018-03-18 MED ORDER — CYCLOBENZAPRINE HCL 10 MG PO TABS: 10.0000 mg | ORAL_TABLET | Freq: Two times a day (BID) | ORAL | 0 refills | Status: DC | PRN

## 2018-03-18 NOTE — ED Triage Notes (Signed)
Pt reports back pains that have been ongoing but 2 days ago he noticed blood in his urine, with dark urine after drinking water all day. Denise discharge or fevers.

## 2018-03-18 NOTE — ED Provider Notes (Signed)
[facility] EMERGENCY DEPARTMENT Provider Note   CSN: [id number] Arrival date & time: 03/18/18  0935    History   Chief Complaint Chief Complaint  Patient presents with  . Back Pain  . Hematuria    HPI Matthew HEBELER is a 33 y.o. male.     The history is provided by the patient. No language interpreter was used.  Back Pain  Hematuria      33 year old male presenting for evaluation of back pain.  Patient report for the past several months he has had intermittent lower back pain.  He described pain as a sharp sensation running across his lower back, sometimes worsening with movement and sometimes associated with some stiffness.  Pain does not radiate down his leg.  2 days ago he noticed that he has to urinate and each time he urinates he noticed some blood in his urine.  He does not complain of any burning sensation when urinating and denies having any penile discharge.  Denies any recent risky sexual behaviors.  No complaints of rash in the penis testicular pain.  No fever or chills no prior history of kidney stone.  He has been using icy hot with some improvement.  He takes Advil on occasion but not on a regular basis.  He denies any recent strenuous activities or heavy lifting.  No complaints of bowel bladder changes or saddle anesthesia.  Past Medical History:  Diagnosis Date  . Back pain     There are no active problems to display for this patient.   History reviewed. No pertinent surgical history.      Home Medications    Prior to Admission medications   Medication Sig Start Date End Date Taking? Authorizing Provider  cephALEXin (KEFLEX) 500 MG capsule Take 1 capsule (500 mg total) by mouth 4 (four) times daily. Patient not taking: Reported on 10/01/2016 08/16/13   Jaynie Crumble, PA-C  cyclobenzaprine (FLEXERIL) 10 MG tablet Take 1 tablet (10 mg total) by mouth 2 (two) times daily as needed for muscle spasms. 09/23/16   Felicie Morn, NP    diazepam (VALIUM) 5 MG tablet Take 1 tablet (5 mg total) by mouth every 8 (eight) hours as needed for muscle spasms. 10/01/16   Shaune Pollack, MD  diphenhydramine-acetaminophen (TYLENOL PM) 25-500 MG TABS tablet Take 2 tablets by mouth at bedtime as needed.    [provider]  ibuprofen (ADVIL,MOTRIN) 600 MG tablet Take 1 tablet (600 mg total) by mouth every 6 (six) hours as needed. Patient not taking: Reported on 10/01/2016 04/21/15   Melton Krebs, PA-C  ketorolac (ACULAR) 0.5 % ophthalmic solution Place 1 drop into both eyes every 6 (six) hours. Patient not taking: Reported on 10/01/2016 03/28/15   Garlon Hatchet, PA-C  methocarbamol (ROBAXIN) 500 MG tablet Take 1 tablet (500 mg total) by mouth 2 (two) times daily. Patient not taking: Reported on 10/01/2016 04/21/15   Melton Krebs, PA-C  oxyCODONE-acetaminophen (PERCOCET) 5-325 MG per tablet Take 1 tablet by mouth every 4 (four) hours as needed for severe pain. Patient not taking: Reported on 10/01/2016 08/16/13   Jaynie Crumble, PA-C  sulfamethoxazole-trimethoprim (BACTRIM DS) 800-160 MG per tablet Take 1 tablet by mouth 2 (two) times daily. Patient not taking: Reported on 10/01/2016 09/21/13   Graylon Good, PA-C  trimethoprim-polymyxin b (POLYTRIM) ophthalmic solution Place 1 drop into both eyes every 4 (four) hours. Patient not taking: Reported on 10/01/2016 03/28/15   Garlon Hatchet, PA-C  Family History No family history on file.  Social History Social History   Tobacco Use  . Smoking status: Never Smoker  . Smokeless tobacco: Never Used  Substance Use Topics  . Alcohol use: Yes    Comment: a beer day  . Drug use: Yes    Types: Marijuana     Allergies   Patient has no known allergies.   Review of Systems Review of Systems  Genitourinary: Positive for hematuria.  Musculoskeletal: Positive for back pain.  All other systems reviewed and are negative.    Physical Exam Updated Vital Signs BP  104/77 (BP Location: Right Arm)   Pulse 66   Temp 98.2 F (36.8 C) (Oral)   Resp 16   SpO2 100%   Physical Exam Vitals signs and nursing note reviewed.  Constitutional:      General: He is not in acute distress.    Appearance: He is well-developed.  HENT:     Head: Atraumatic.  Eyes:     Conjunctiva/sclera: Conjunctivae normal.  Neck:     Musculoskeletal: Neck supple.  Cardiovascular:     Rate and Rhythm: Normal rate and regular rhythm.  Pulmonary:     Effort: Pulmonary effort is normal.     Breath sounds: Normal breath sounds.  Abdominal:     Palpations: Abdomen is soft.     Tenderness: There is no abdominal tenderness.  Genitourinary:    Comments: No CVA tenderness Musculoskeletal:        General: Tenderness (Lumbar and paralumbar spinal muscle on palpation without any overlying skin changes, no crepitus or step-off.  Full range of motion) present.     Comments: Normal straight leg raise.  Skin:    Findings: No rash.  Neurological:     Mental Status: He is alert.      ED Treatments / Results  Labs (all labs ordered are listed, but only abnormal results are displayed) Labs Reviewed  URINALYSIS, ROUTINE W REFLEX MICROSCOPIC - Abnormal; Notable for the following components:      Result Value   APPearance HAZY (*)    Ketones, ur 5 (*)    Protein, ur 30 (*)    Bacteria, UA MANY (*)    All other components within normal limits  I-STAT CREATININE, ED  CBG MONITORING, ED  POCT I-STAT EG7    EKG None  Radiology No results found.  Procedures Procedures (including critical care time)  Medications Ordered in ED Medications - No data to display   Initial Impression / Assessment and Plan / ED Course  I have reviewed the triage vital signs and the nursing notes.  Pertinent labs & imaging results that were available during my care of the patient were reviewed by me and considered in my medical decision making (see chart for details).        BP 104/77 (BP  Location: Right Arm)   Pulse 66   Temp 98.2 F (36.8 C) (Oral)   Resp 16   SpO2 100%    Final Clinical Impressions(s) / ED Diagnoses   Final diagnoses:  Acute bilateral low back pain without sciatica  Acute lower UTI    ED Discharge Orders         Ordered    doxycycline (VIBRAMYCIN) 100 MG capsule  2 times daily     03/18/18 1214    cyclobenzaprine (FLEXERIL) 10 MG tablet  2 times daily PRN     03/18/18 1214         12:10  PM Patient here with low back pain along with noticing some blood in his urine along with urinary discomfort.  He does not have any CVA tenderness.  He does not appear to have signs and symptoms of atypical kidney stone.  Urinalysis showed no evidence of hemoglobin you on urine dipsticks.  There are many bacteria as well as evidence of protein and ketones.  Sperm is present.  He denies any risky sexual behaviors and not concern of STI.  His kidney function is normal based on labs.  He ambulates without difficulty.  He does not have any radicular pain.  Will provide doxycycline as treatment for UTI.  Muscle relaxant for back pain as well.  Encourage patient to follow-up with PCP for recheck.   Fayrene Helper, PA-C 03/18/18 1215    Arby Barrette, MD 03/18/18 [phone]

## 2018-03-18 NOTE — ED Notes (Signed)
Pt endorses lower back pain "for a while" and 2 days ago pt had blood in urine, none since.

## 2019-07-23 DIAGNOSIS — S83282A Other tear of lateral meniscus, current injury, left knee, initial encounter: Secondary | ICD-10-CM

## 2019-07-23 HISTORY — DX: Other tear of lateral meniscus, current injury, left knee, initial encounter: S83.282A

## 2019-09-27 DIAGNOSIS — U071 COVID-19: Secondary | ICD-10-CM

## 2019-09-27 HISTORY — DX: COVID-19: U07.1

## 2019-10-04 ENCOUNTER — Encounter (HOSPITAL_BASED_OUTPATIENT_CLINIC_OR_DEPARTMENT_OTHER): Payer: Self-pay | Admitting: Orthopedic Surgery

## 2019-10-04 ENCOUNTER — Other Ambulatory Visit (HOSPITAL_COMMUNITY): Payer: Self-pay

## 2019-10-04 ENCOUNTER — Other Ambulatory Visit: Payer: Self-pay

## 2019-10-04 NOTE — Progress Notes (Addendum)
Called and spoke with patient and made pt aware unable to contact [facility] for + covid test results from 09-20-2019, pt is going to [facility] and pick up copy of positive covid test results from 09-20-2019 and notify Matthew Cocker rn when he gets results.

## 2019-10-04 NOTE — Progress Notes (Signed)
Spoke w/ via phone for pre-op interview---pt Lab needs dos---- none              Lab results------called [facility] x 3 at [phone] , no naswer to get + covid test results from 09-27-2019 fax number for medical records [phone] does not work, pt to try to get email of covid test results and email to me. COVID test ------+ covid 09-27-2019, no retest needed Arrive at ------- NPO after MN NO Solid Food.  Clear liquids from MN until---430 am then npo Medications to take morning of surgery -----none Diabetic medication -----n/a Patient Special Instructions -----none Pre-Op special Istructions -----none Patient verbalized understanding of instructions that were given at this phone interview. Patient denies shortness of breath, chest pain, fever, cough at this phone interview.

## 2019-10-05 ENCOUNTER — Encounter (HOSPITAL_BASED_OUTPATIENT_CLINIC_OR_DEPARTMENT_OTHER): Payer: Self-pay | Admitting: Orthopedic Surgery

## 2019-10-05 NOTE — Anesthesia Preprocedure Evaluation (Addendum)
Anesthesia Evaluation  Patient identified by MRN, date of birth, ID band Patient awake    Reviewed: Allergy & Precautions, NPO status , Patient's Chart, lab work & pertinent test results  Airway Mallampati: II  TM Distance: >3 FB Neck ROM: Full    Dental no notable dental hx. (+) Teeth Intact, Dental Advisory Given   Pulmonary Current Smoker,    Pulmonary exam normal breath sounds clear to auscultation       Cardiovascular negative cardio ROS Normal cardiovascular exam Rhythm:Regular Rate:Normal     Neuro/Psych negative neurological ROS  negative psych ROS   GI/Hepatic negative GI ROS, Neg liver ROS,   Endo/Other  negative endocrine ROS  Renal/GU negative Renal ROS     Musculoskeletal negative musculoskeletal ROS (+)   Abdominal   Peds  Hematology negative hematology ROS (+)   Anesthesia Other Findings   Reproductive/Obstetrics                            Anesthesia Physical Anesthesia Plan  ASA: I  Anesthesia Plan: General   Post-op Pain Management:  Regional for Post-op pain   Induction: Intravenous  PONV Risk Score and Plan: Treatment may vary due to age or medical condition, Ondansetron, Dexamethasone and Midazolam  Airway Management Planned: LMA  Additional Equipment: None  Intra-op Plan:   Post-operative Plan:   Informed Consent: I have reviewed the patients History and Physical, chart, labs and discussed the procedure including the risks, benefits and alternatives for the proposed anesthesia with the patient or authorized representative who has indicated his/her understanding and acceptance.     Dental advisory given  Plan Discussed with: CRNA and Anesthesiologist  Anesthesia Plan Comments: (GA w L Adductor Canal block)       Anesthesia Quick Evaluation

## 2019-10-06 ENCOUNTER — Ambulatory Visit (HOSPITAL_BASED_OUTPATIENT_CLINIC_OR_DEPARTMENT_OTHER)
Admission: RE | Admit: 2019-10-06 | Discharge: 2019-10-06 | Disposition: A | Discharge status: Home / Self Care | Payer: 59 | Attending: Orthopedic Surgery | Admitting: Orthopedic Surgery

## 2019-10-06 ENCOUNTER — Ambulatory Visit (HOSPITAL_BASED_OUTPATIENT_CLINIC_OR_DEPARTMENT_OTHER): Payer: 59 | Admitting: Anesthesiology

## 2019-10-06 ENCOUNTER — Encounter (HOSPITAL_BASED_OUTPATIENT_CLINIC_OR_DEPARTMENT_OTHER)
Admission: RE | Disposition: A | Discharge status: Home / Self Care | Payer: Self-pay | Source: Home / Self Care | Attending: Orthopedic Surgery

## 2019-10-06 ENCOUNTER — Encounter (HOSPITAL_BASED_OUTPATIENT_CLINIC_OR_DEPARTMENT_OTHER): Payer: Self-pay | Admitting: Orthopedic Surgery

## 2019-10-06 DIAGNOSIS — G8929 Other chronic pain: Secondary | ICD-10-CM | POA: Insufficient documentation

## 2019-10-06 DIAGNOSIS — S83282A Other tear of lateral meniscus, current injury, left knee, initial encounter: Secondary | ICD-10-CM | POA: Diagnosis present

## 2019-10-06 DIAGNOSIS — X58XXXA Exposure to other specified factors, initial encounter: Secondary | ICD-10-CM | POA: Diagnosis not present

## 2019-10-06 DIAGNOSIS — M94262 Chondromalacia, left knee: Secondary | ICD-10-CM | POA: Diagnosis not present

## 2019-10-06 DIAGNOSIS — F17210 Nicotine dependence, cigarettes, uncomplicated: Secondary | ICD-10-CM | POA: Diagnosis not present

## 2019-10-06 HISTORY — PX: KNEE ARTHROSCOPY WITH LATERAL MENISECTOMY: SHX6193

## 2019-10-06 SURGERY — ARTHROSCOPY, KNEE, WITH LATERAL MENISCECTOMY
Anesthesia: General | Site: Knee | Laterality: Left

## 2019-10-06 MED ORDER — PROPOFOL 10 MG/ML IV BOLUS
INTRAVENOUS | Status: DC | PRN
  Administered 2019-10-06: 200 mg via INTRAVENOUS

## 2019-10-06 MED ORDER — FENTANYL CITRATE (PF) 100 MCG/2ML IJ SOLN
INTRAMUSCULAR | Status: DC | PRN
Start: 2019-10-06 — End: 2019-10-06
  Administered 2019-10-06: 50 ug via INTRAVENOUS

## 2019-10-06 MED ORDER — FENTANYL CITRATE (PF) 100 MCG/2ML IJ SOLN
INTRAMUSCULAR | Status: AC
  Filled 2019-10-06: qty 2

## 2019-10-06 MED ORDER — DEXAMETHASONE SODIUM PHOSPHATE 10 MG/ML IJ SOLN
INTRAMUSCULAR | Status: DC | PRN
  Administered 2019-10-06: 8 mg via INTRAVENOUS

## 2019-10-06 MED ORDER — LACTATED RINGERS IV SOLN: INTRAVENOUS | Status: DC

## 2019-10-06 MED ORDER — CLONIDINE HCL (ANALGESIA) 100 MCG/ML EP SOLN
EPIDURAL | Status: DC | PRN
  Administered 2019-10-06: 100 ug

## 2019-10-06 MED ORDER — OXYCODONE HCL 5 MG PO TABS: 5.0000 mg | ORAL_TABLET | Freq: Once | ORAL | Status: DC | PRN

## 2019-10-06 MED ORDER — FENTANYL CITRATE (PF) 100 MCG/2ML IJ SOLN
100.0000 ug | Freq: Once | INTRAMUSCULAR | Status: AC
  Administered 2019-10-06: 100 ug via INTRAVENOUS

## 2019-10-06 MED ORDER — ONDANSETRON HCL 4 MG/2ML IJ SOLN
INTRAMUSCULAR | Status: DC | PRN
  Administered 2019-10-06: 4 mg via INTRAVENOUS

## 2019-10-06 MED ORDER — KETOROLAC TROMETHAMINE 30 MG/ML IJ SOLN: 30.0000 mg | Freq: Once | INTRAMUSCULAR | Status: DC | PRN

## 2019-10-06 MED ORDER — FENTANYL CITRATE (PF) 250 MCG/5ML IJ SOLN
INTRAMUSCULAR | Status: AC
  Filled 2019-10-06: qty 5

## 2019-10-06 MED ORDER — MIDAZOLAM HCL 2 MG/2ML IJ SOLN
INTRAMUSCULAR | Status: AC
  Filled 2019-10-06: qty 2

## 2019-10-06 MED ORDER — CEFAZOLIN SODIUM-DEXTROSE 2-4 GM/100ML-% IV SOLN
INTRAVENOUS | Status: AC
  Filled 2019-10-06: qty 100

## 2019-10-06 MED ORDER — ROPIVACAINE HCL 7.5 MG/ML IJ SOLN
INTRAMUSCULAR | Status: DC | PRN
  Administered 2019-10-06: 20 mL via PERINEURAL

## 2019-10-06 MED ORDER — OXYCODONE HCL 5 MG/5ML PO SOLN: 5.0000 mg | Freq: Once | ORAL | Status: DC | PRN

## 2019-10-06 MED ORDER — HYDROCODONE-ACETAMINOPHEN 5-325 MG PO TABS
2.0000 | ORAL_TABLET | Freq: Four times a day (QID) | ORAL | 0 refills | Status: AC | PRN
Start: 2019-10-06 — End: 2020-10-05

## 2019-10-06 MED ORDER — HYDROMORPHONE HCL 1 MG/ML IJ SOLN: 0.2500 mg | INTRAMUSCULAR | Status: DC | PRN

## 2019-10-06 MED ORDER — PROPOFOL 10 MG/ML IV BOLUS
INTRAVENOUS | Status: AC
  Filled 2019-10-06: qty 20

## 2019-10-06 MED ORDER — EPHEDRINE SULFATE-NACL 50-0.9 MG/10ML-% IV SOSY
PREFILLED_SYRINGE | INTRAVENOUS | Status: DC | PRN
  Administered 2019-10-06 (×3): 5 mg via INTRAVENOUS

## 2019-10-06 MED ORDER — MIDAZOLAM HCL 2 MG/2ML IJ SOLN
2.0000 mg | Freq: Once | INTRAMUSCULAR | Status: AC
  Administered 2019-10-06: 2 mg via INTRAVENOUS

## 2019-10-06 MED ORDER — ONDANSETRON 4 MG PO TBDP: 4.0000 mg | ORAL_TABLET | Freq: Three times a day (TID) | ORAL | 0 refills | Status: AC | PRN

## 2019-10-06 MED ORDER — ONDANSETRON HCL 4 MG/2ML IJ SOLN: 4.0000 mg | Freq: Once | INTRAMUSCULAR | Status: DC | PRN

## 2019-10-06 MED ORDER — LIDOCAINE 2% (20 MG/ML) 5 ML SYRINGE
INTRAMUSCULAR | Status: AC
  Filled 2019-10-06: qty 5

## 2019-10-06 MED ORDER — ONDANSETRON HCL 4 MG/2ML IJ SOLN
INTRAMUSCULAR | Status: AC
  Filled 2019-10-06: qty 2

## 2019-10-06 MED ORDER — CEFAZOLIN SODIUM-DEXTROSE 2-4 GM/100ML-% IV SOLN
2.0000 g | INTRAVENOUS | Status: AC
  Administered 2019-10-06: 2 g via INTRAVENOUS

## 2019-10-06 MED ORDER — SODIUM CHLORIDE 0.9 % IR SOLN
Status: DC | PRN
  Administered 2019-10-06: 2500 mL

## 2019-10-06 MED ORDER — DEXAMETHASONE SODIUM PHOSPHATE 10 MG/ML IJ SOLN
INTRAMUSCULAR | Status: AC
  Filled 2019-10-06: qty 1

## 2019-10-06 MED ORDER — EPHEDRINE 5 MG/ML INJ
INTRAVENOUS | Status: AC
  Filled 2019-10-06: qty 10

## 2019-10-06 MED ORDER — LIDOCAINE 2% (20 MG/ML) 5 ML SYRINGE
INTRAMUSCULAR | Status: DC | PRN
  Administered 2019-10-06: 40 mg via INTRAVENOUS

## 2019-10-06 SURGICAL SUPPLY — 45 items
ABLATOR ASPIRATE 50D MULTI-PRT (SURGICAL WAND) IMPLANT
BANDAGE ESMARK 6X9 LF (GAUZE/BANDAGES/DRESSINGS) IMPLANT
BLADE SHAVER TORPEDO 4X13 (MISCELLANEOUS) ×3 IMPLANT
BNDG CMPR 9X6 STRL LF SNTH (GAUZE/BANDAGES/DRESSINGS)
BNDG ELASTIC 6X5.8 VLCR STR LF (GAUZE/BANDAGES/DRESSINGS) ×3 IMPLANT
BNDG ESMARK 6X9 LF (GAUZE/BANDAGES/DRESSINGS)
BNDG GAUZE ELAST 4 BULKY (GAUZE/BANDAGES/DRESSINGS) ×2 IMPLANT
COVER WAND RF STERILE (DRAPES) ×3 IMPLANT
CUFF TOURN SGL QUICK 24 (TOURNIQUET CUFF) ×3
CUFF TOURN SGL QUICK 34 (TOURNIQUET CUFF)
CUFF TRNQT CYL 24X4X16.5-23 (TOURNIQUET CUFF) ×1 IMPLANT
CUFF TRNQT CYL 34X4.125X (TOURNIQUET CUFF) ×1 IMPLANT
DRAPE ARTHROSCOPY W/POUCH 114 (DRAPES) ×3 IMPLANT
DRAPE U-SHAPE 47X51 STRL (DRAPES) ×3 IMPLANT
DRSG PAD ABDOMINAL 8X10 ST (GAUZE/BANDAGES/DRESSINGS) ×3 IMPLANT
DURAPREP 26ML APPLICATOR (WOUND CARE) ×3 IMPLANT
GAUZE SPONGE 4X4 12PLY STRL (GAUZE/BANDAGES/DRESSINGS) ×3 IMPLANT
GAUZE SPONGE 4X4 12PLY STRL LF (GAUZE/BANDAGES/DRESSINGS) ×2 IMPLANT
GAUZE XEROFORM 1X8 LF (GAUZE/BANDAGES/DRESSINGS) ×3 IMPLANT
GLOVE BIO SURGEON STRL SZ7.5 (GLOVE) ×3 IMPLANT
GLOVE BIOGEL PI IND STRL 7.5 (GLOVE) IMPLANT
GLOVE BIOGEL PI IND STRL 8 (GLOVE) ×1 IMPLANT
GLOVE BIOGEL PI INDICATOR 7.5 (GLOVE) ×2
GLOVE BIOGEL PI INDICATOR 8 (GLOVE) ×2
GLOVE ECLIPSE 7.5 STRL STRAW (GLOVE) ×3 IMPLANT
GOWN STRL REUS W/TWL LRG LVL3 (GOWN DISPOSABLE) ×2 IMPLANT
GOWN STRL REUS W/TWL XL LVL3 (GOWN DISPOSABLE) ×4 IMPLANT
IV NS IRRIG 3000ML ARTHROMATIC (IV SOLUTION) ×3 IMPLANT
KIT TURNOVER CYSTO (KITS) ×3 IMPLANT
MANIFOLD NEPTUNE II (INSTRUMENTS) ×3 IMPLANT
PACK ARTHROSCOPY DSU (CUSTOM PROCEDURE TRAY) ×3 IMPLANT
PACK BASIN DAY SURGERY FS (CUSTOM PROCEDURE TRAY) ×3 IMPLANT
PAD ARMBOARD 7.5X6 YLW CONV (MISCELLANEOUS) IMPLANT
PAD CAST 4YDX4 CTTN HI CHSV (CAST SUPPLIES) IMPLANT
PADDING CAST COTTON 4X4 STRL (CAST SUPPLIES) ×3
PORT APPOLLO RF 90DEGREE MULTI (SURGICAL WAND) IMPLANT
STOCKING KNEE LG LNG (STOCKING) ×2 IMPLANT
SUT ETHILON 4 0 PS 2 18 (SUTURE) ×3 IMPLANT
SYR CONTROL 10ML LL (SYRINGE) ×1 IMPLANT
TOWEL OR 17X26 10 PK STRL BLUE (TOWEL DISPOSABLE) ×3 IMPLANT
TUBE CONNECTING 12'X1/4 (SUCTIONS) ×2
TUBE CONNECTING 12X1/4 (SUCTIONS) ×4 IMPLANT
TUBING ARTHROSCOPY IRRIG 16FT (MISCELLANEOUS) ×3 IMPLANT
WATER STERILE IRR 500ML POUR (IV SOLUTION) ×1 IMPLANT
WRAP KNEE MAXI GEL POST OP (GAUZE/BANDAGES/DRESSINGS) ×2 IMPLANT

## 2019-10-06 NOTE — Brief Op Note (Signed)
10/06/2019  8:15 AM  PATIENT:  Matthew Valentine  34 y.o. male  PRE-OPERATIVE DIAGNOSIS:  Left knee lateral meniscus tear, loose body  POST-OPERATIVE DIAGNOSIS:  Left knee lateral meniscus tear, loose body  PROCEDURE:  Procedure(s) with comments: LEFT KNEE ARTHROSCOPY WITH PARTIAL LATERAL MENISECTOMY WITH CHONDROPLASTY TROCHLEA (Left) - 60 mins  SURGEON:  Surgeon(s) and Role:    * Aundria Rud, Noah Delaine, MD - Primary  PHYSICIAN ASSISTANT: Dion Saucier PA-C  ANESTHESIA:   regional and general  EBL:  5 cc  BLOOD ADMINISTERED:none  DRAINS: none   LOCAL MEDICATIONS USED:  NONE  SPECIMEN:  No Specimen  DISPOSITION OF SPECIMEN:  N/A  COUNTS:  YES  TOURNIQUET:  * Missing tourniquet times found for documented tourniquets in log: [id number] *  DICTATION: .Note written in EPIC  PLAN OF CARE: Discharge to home after PACU  PATIENT DISPOSITION:  PACU - hemodynamically stable.   Delay start of Pharmacological VTE agent (>24hrs) due to surgical blood loss or risk of bleeding: not applicable

## 2019-10-06 NOTE — Anesthesia Procedure Notes (Signed)
Anesthesia Regional Block: Adductor canal block   Pre-Anesthetic Checklist: ,, timeout performed, Correct Patient, Correct Site, Correct Laterality, Correct Procedure, Correct Position, site marked, Risks and benefits discussed,  Surgical consent,  Pre-op evaluation,  At surgeon's request and post-op pain management  Laterality: Lower and Left  Prep: chloraprep       Needles:  Injection technique: Single-shot  Needle Type: Echogenic Needle     Needle Length: 9cm  Needle Gauge: 22     Additional Needles:   Procedures:,,,, ultrasound used (permanent image in chart),,,,  Narrative:  Start time: 10/06/2019 7:06 AM End time: 10/06/2019 7:11 AM Injection made incrementally with aspirations every 5 mL.  Performed by: Personally  Anesthesiologist: Trevor Iha, MD  Additional Notes: Block assessed prior to surgery. Pt tolerated procedure well.

## 2019-10-06 NOTE — H&P (Signed)
ORTHOPAEDIC H and P  REQUESTING PHYSICIAN: Yolonda Kida, MD  PCP:  Patient, No Pcp Per  Chief Complaint: Left knee pain  HPI: Matthew Valentine is a 34 y.o. male who complains of chronic left knee pain. He is here today for left knee arthroscopy with partial lateral meniscectomy and possible microfracture as well as loose body removal. No new complaints today.  Past Medical History:  Diagnosis Date  . Acute lateral meniscus tear of left knee 07/23/2019   wears knee brace prn  . Back pain   . COVID-19 09/20/2019 migraines for a few days all symptoms resolved   cold like symptoms quarantine ended 99-03-2019 all symptoms resolved   Past Surgical History:  Procedure Laterality Date  . NO PAST SURGERIES     Social History   Socioeconomic History  . Marital status: Single    Spouse name: Not on file  . Number of children: Not on file  . Years of education: Not on file  . Highest education level: Not on file  Occupational History  . Not on file  Tobacco Use  . Smoking status: Current Some Day Smoker    Packs/day: 0.50    Years: 9.00    Pack years: 4.50    Types: Cigarettes    Last attempt to quit: 01/27/2006    Years since quitting: 13.6  . Smokeless tobacco: Never Used  Vaping Use  . Vaping Use: Never used  Substance and Sexual Activity  . Alcohol use: Yes    Comment: 2-3 glasses win per week  . Drug use: Yes    Types: Marijuana    Comment: marijuana last used 10-04-2019  . Sexual activity: Not on file  Other Topics Concern  . Not on file  Social History Narrative  . Not on file   Social Determinants of Health   Financial Resource Strain:   . Difficulty of Paying Living Expenses: Not on file  Food Insecurity:   . Worried About Programme researcher, broadcasting/film/video in the Last Year: Not on file  . Ran Out of Food in the Last Year: Not on file  Transportation Needs:   . Lack of Transportation (Medical): Not on file  . Lack of Transportation (Non-Medical): Not on file    Physical Activity:   . Days of Exercise per Week: Not on file  . Minutes of Exercise per Session: Not on file  Stress:   . Feeling of Stress : Not on file  Social Connections:   . Frequency of Communication with Friends and Family: Not on file  . Frequency of Social Gatherings with Friends and Family: Not on file  . Attends Religious Services: Not on file  . Active Member of Clubs or Organizations: Not on file  . Attends Banker Meetings: Not on file  . Marital Status: Not on file   History reviewed. No pertinent family history. No Known Allergies Prior to Admission medications   Medication Sig Start Date End Date Taking? Authorizing Provider  acetaminophen (TYLENOL) 500 MG tablet Take 1,000 mg by mouth every 6 (six) hours as needed.   Yes [provider]   No results found.  Positive ROS: All other systems have been reviewed and were otherwise negative with the exception of those mentioned in the HPI and as above.  Physical Exam: General: Alert, no acute distress Cardiovascular: No pedal edema Respiratory: No cyanosis, no use of accessory musculature GI: No organomegaly, abdomen is soft and non-tender Skin: No lesions  in the area of chief complaint Neurologic: Sensation intact distally Psychiatric: Patient is competent for consent with normal mood and affect Lymphatic: No axillary or cervical lymphadenopathy  MUSCULOSKELETAL:  Left leg is warm and well-perfused. No open wounds. Neurovascularly intact.  Assessment: 1. Left knee lateral meniscus tear, acute. 2. Left knee chondral defect with loose body.  Plan: -Plan is to proceed today with arthroscopic surgery on the left knee. We again reviewed the risk and benefits of arthroscopy. Discussed our plan for partial lateral meniscectomy and possible loose body removal. Discussed the risk of bleeding, infection, damage to surrounding nerves and vessels, stiffness, DVT, and risk of anesthesia. He has  provided informed consent.  -Plan for discharge home postoperatively from PACU.    Yolonda Kida, MD Cell [phone]    10/06/2019 7:16 AM

## 2019-10-06 NOTE — Transfer of Care (Signed)
Immediate Anesthesia Transfer of Care Note  Patient: Matthew Valentine  Procedure(s) Performed: LEFT KNEE ARTHROSCOPY WITH PARTIAL LATERAL MENISECTOMY WITH CHONDROPLASTY TROCHLEA (Left Knee)  Patient Location: PACU  Anesthesia Type:GA combined with regional for post-op pain  Level of Consciousness: drowsy and patient cooperative  Airway & Oxygen Therapy: Patient Spontanous Breathing and Patient connected to face mask oxygen  Post-op Assessment: Report given to RN and Post -op Vital signs reviewed and stable  Post vital signs: Reviewed and stable  Last Vitals:  Vitals Value Taken Time  BP 135/86 10/06/19 0828  Temp    Pulse 86 10/06/19 0830  Resp 16 10/06/19 0830  SpO2 100 % 10/06/19 0830  Vitals shown include unvalidated device data.  Last Pain:  Vitals:   10/06/19 0600  TempSrc: Oral  PainSc: 5       Patients Stated Pain Goal: 5 (10/06/19 0600)  Complications: No complications documented.

## 2019-10-06 NOTE — Anesthesia Postprocedure Evaluation (Signed)
Anesthesia Post Note  Patient: Matthew Valentine  Procedure(s) Performed: LEFT KNEE ARTHROSCOPY WITH PARTIAL LATERAL MENISECTOMY WITH CHONDROPLASTY TROCHLEA (Left Knee)     Patient location during evaluation: PACU Anesthesia Type: General Level of consciousness: awake and alert Pain management: pain level controlled Vital Signs Assessment: post-procedure vital signs reviewed and stable Respiratory status: spontaneous breathing, nonlabored ventilation, respiratory function stable and patient connected to nasal cannula oxygen Cardiovascular status: blood pressure returned to baseline and stable Postop Assessment: no apparent nausea or vomiting Anesthetic complications: no   No complications documented.  Last Vitals:  Vitals:   10/06/19 0945 10/06/19 1005  BP: (!) 99/57 117/71  Pulse: (!) 54 74  Resp: 15   Temp:  36.4 C  SpO2: 98% 100%    Last Pain:  Vitals:   10/06/19 1005  TempSrc:   PainSc: 0-No pain                 Trevor Iha

## 2019-10-06 NOTE — Progress Notes (Signed)
AssistedDr. Houser with left, ultrasound guided, adductor canal block. Side rails up, monitors on throughout procedure. See vital signs in flow sheet. Tolerated Procedure well.  

## 2019-10-06 NOTE — Op Note (Signed)
Surgery Date: 10/06/2019  Surgeon(s): Yolonda Kida, MD  ANESTHESIA:  General  Physician Assistant: Dion Saucier, PA-C.  Present throughout the entire procedure utilized for critical portions including positioning the patient and holding the leg for critical portions of the procedure.  Also utilized for closure and transported back to the recovery unit.  FLUIDS: Per anesthesia record.   ESTIMATED BLOOD LOSS: minimal  PREOPERATIVE DIAGNOSES:  1.  Left knee lateral meniscus tear 2.  knee synovitis 3.  Left knee chondromalacia trochlea grade 4  POSTOPERATIVE DIAGNOSES:  same  PROCEDURES PERFORMED:  1.  Left knee arthroscopy with limited synovectomy 2.  Left  knee arthroscopy with arthroscopic partial lateral meniscectomy 3.  Left  knee arthroscopy with arthroscopic chondroplasty trochlea  DESCRIPTION OF PROCEDURE: Matthew Valentine is a 34 y.o.-year-old male with left knee symptomatic lateral meniscus tear. Plans are to proceed with partial lateral meniscectomy and diagnostic arthroscopy with debridement as indicated. Full discussion held regarding risks benefits alternatives and complications related surgical intervention. Conservative care options reviewed. All questions answered.  The patient was identified in the preoperative holding area and the operative extremity was marked. The patient was brought to the operating room and transferred to operating table in a supine position. Satisfactory general anesthesia was induced by anesthesiology.    Standard anterolateral, anteromedial arthroscopy portals were obtained. The anteromedial portal was obtained with a spinal needle for localization under direct visualization with subsequent diagnostic findings.   Anteromedial and anterolateral chambers: mild synovitis. The synovitis was debrided with a 4.5 mm full radius shaver through both the anteromedial and lateral portals.  No substantial loose bodies were identified.  There were  multiple small cartilaginous pieces in the knee that were lavaged.  Suprapatellar pouch and gutters: mild synovitis or debris. Patella chondral surface: Grade 0 Trochlear chondral surface: Grade 4 lesion on the lateral aspect of the trochlea.  This area actually had pretty robust fibrocartilage present at the time of surgery Patellofemoral tracking: Midline Medial meniscus: Intact.  Medial femoral condyle flexion bearing surface: Grade 0 Medial femoral condyle extension bearing surface: Grade 0 Medial tibial plateau: Grade 0 Anterior cruciate ligament:stable Posterior cruciate ligament:stable Lateral meniscus: Complex tear of the lateral meniscus.  There was a short radial tear at the junction of the anterior horn and mid body.  This was in the white zone.  There was also an undersurface tear on the posterior horn in a horizontal fashion.  This created an unstable flap directly posteriorly.  There was no full-thickness component to this and superior leaflet was intact.   Lateral femoral condyle flexion bearing surface: Grade 0 Lateral femoral condyle extension bearing surface: Grade 0 Lateral tibial plateau: Grade 0  Partial lateral meniscectomy was carried out combination of motorized shaver and meniscal biters.  Following completion of the partial lateral meniscectomy of the mid body and posterior horn total surface area resected was less than 20%.  Chondroplasty was carried out along the trochlea with combination of basket biter as well as motorized shaver.  There was pretty robust fibrocartilage in this grade 4 lesion.  This was probed and palpated and felt to be stable.  We did not unroof this and elected to not proceed with microfracture.  After completion of synovectomy, diagnostic exam, and debridements as described, all compartments were checked and no residual debris remained. Hemostasis was achieved with the cautery wand. The portals were approximated with buried monocryl. All excess  fluid was expressed from the joint.  Xeroform sterile gauze dressings were applied  followed by Ace bandage and ice pack.   DISPOSITION: The patient was awakened from general anesthetic, extubated, taken to the recovery room in medically stable condition, no apparent complications. The patient may be weightbearing as tolerated to the operative lower extremity.  Range of motion of left knee as tolerated.

## 2019-10-06 NOTE — Addendum Note (Signed)
Addendum  created 10/06/19 1047 by Epimenio Sarin, CRNA   Charge Capture section accepted

## 2019-10-06 NOTE — Discharge Instructions (Signed)
-  Maintain postoperative bandage for 3 days. You may remove the bandages on the third day and begin showering.  -You are okay for full weightbearing as tolerated to the left leg. You should also begin range of motion and moving the knee as tolerated.  -Apply ice to the left knee for 30 minutes out of each hour you are able. She do this around-the-clock.  -For the prevention of blood clots take an 81 mg aspirin once per day for 6 weeks.  -For mild to moderate pain use Tylenol and Advil around-the-clock. For breakthrough pain use Norco as necessary.  -Return to see Dr. Aundria Rud in 2 weeks for routine postoperative care.   Post Anesthesia Home Care Instructions  Activity: Get plenty of rest for the remainder of the day. A responsible individual must stay with you for 24 hours following the procedure.  For the next 24 hours, DO NOT: -Drive a car -Advertising copywriter -Drink alcoholic beverages -Take any medication unless instructed by your physician -Make any legal decisions or sign important papers.  Meals: Start with liquid foods such as gelatin or soup. Progress to regular foods as tolerated. Avoid greasy, spicy, heavy foods. If nausea and/or vomiting occur, drink only clear liquids until the nausea and/or vomiting subsides. Call your physician if vomiting continues.  Special Instructions/Symptoms: Your throat may feel dry or sore from the anesthesia or the breathing tube placed in your throat during surgery. If this causes discomfort, gargle with warm salt water. The discomfort should disappear within 24 hours.  Regional Anesthesia Blocks  1. Numbness or the inability to move the "blocked" extremity may last from 3-48 hours after placement. The length of time depends on the medication injected and your individual response to the medication. If the numbness is not going away after 48 hours, call your surgeon.  2. The extremity that is blocked will need to be protected until the numbness is  gone and the  Strength has returned. Because you cannot feel it, you will need to take extra care to avoid injury. Because it may be weak, you may have difficulty moving it or using it. You may not know what position it is in without looking at it while the block is in effect.  3. For blocks in the legs and feet, returning to weight bearing and walking needs to be done carefully. You will need to wait until the numbness is entirely gone and the strength has returned. You should be able to move your leg and foot normally before you try and bear weight or walk. You will need someone to be with you when you first try to ensure you do not fall and possibly risk injury.  4. Bruising and tenderness at the needle site are common side effects and will resolve in a few days.  5. Persistent numbness or new problems with movement should be communicated to the surgeon.

## 2019-10-06 NOTE — Anesthesia Procedure Notes (Signed)
Procedure Name: LMA Insertion Date/Time: 10/06/2019 7:40 AM Performed by: Epimenio Sarin, CRNA Pre-anesthesia Checklist: Patient identified, Emergency Drugs available, Suction available, Patient being monitored and Timeout performed Patient Re-evaluated:Patient Re-evaluated prior to induction Oxygen Delivery Method: Circle system utilized Preoxygenation: Pre-oxygenation with 100% oxygen Induction Type: IV induction Ventilation: Mask ventilation without difficulty LMA: LMA with gastric port inserted LMA Size: 4.0 Number of attempts: 1 Dental Injury: Teeth and Oropharynx as per pre-operative assessment

## 2019-10-07 ENCOUNTER — Encounter (HOSPITAL_BASED_OUTPATIENT_CLINIC_OR_DEPARTMENT_OTHER): Payer: Self-pay | Admitting: Orthopedic Surgery

## 2019-11-17 ENCOUNTER — Emergency Department (HOSPITAL_COMMUNITY)
Admission: EM | Admit: 2019-11-17 | Discharge: 2019-11-17 | Disposition: A | Discharge status: Home / Self Care | Payer: 59 | Attending: Emergency Medicine | Admitting: Emergency Medicine

## 2019-11-17 ENCOUNTER — Other Ambulatory Visit: Payer: Self-pay

## 2019-11-17 ENCOUNTER — Encounter (HOSPITAL_COMMUNITY): Payer: Self-pay

## 2019-11-17 DIAGNOSIS — X58XXXA Exposure to other specified factors, initial encounter: Secondary | ICD-10-CM | POA: Diagnosis not present

## 2019-11-17 DIAGNOSIS — F17210 Nicotine dependence, cigarettes, uncomplicated: Secondary | ICD-10-CM | POA: Insufficient documentation

## 2019-11-17 DIAGNOSIS — S161XXA Strain of muscle, fascia and tendon at neck level, initial encounter: Secondary | ICD-10-CM | POA: Diagnosis not present

## 2019-11-17 DIAGNOSIS — Z8616 Personal history of COVID-19: Secondary | ICD-10-CM | POA: Diagnosis not present

## 2019-11-17 DIAGNOSIS — M542 Cervicalgia: Secondary | ICD-10-CM | POA: Diagnosis present

## 2019-11-17 MED ORDER — METHOCARBAMOL 500 MG PO TABS: 500.0000 mg | ORAL_TABLET | Freq: Two times a day (BID) | ORAL | 0 refills | Status: AC

## 2019-11-17 MED ORDER — PREDNISONE 20 MG PO TABS: 40.0000 mg | ORAL_TABLET | Freq: Every day | ORAL | 0 refills | Status: AC

## 2019-11-17 MED ORDER — KETOROLAC TROMETHAMINE 30 MG/ML IJ SOLN
30.0000 mg | Freq: Once | INTRAMUSCULAR | Status: AC
  Administered 2019-11-17: 30 mg via INTRAMUSCULAR
  Filled 2019-11-17: qty 1

## 2019-11-17 NOTE — ED Triage Notes (Signed)
Patient arrived stating that over the last two days he has been sore in his upper back/neck. Declines any known fall/injury. Declines taking any OTC medication.

## 2019-11-17 NOTE — ED Provider Notes (Signed)
[facility] Provider Note   CSN: [id number] Arrival date & time: 11/17/19  7001     History No chief complaint on file.   Matthew Valentine is a 34 y.o. male.  HPI Patient with upper back/left-sided neck pain.  Has had for the last day or 2.  No relief with ice.  Worse with movements.  No fall.  States began after sleeping.  No numbness or weakness down the arm.  States it does hurt in the neck when he steps on the left foot.  Has had similar symptoms a few years ago but that was down the other side.  Did have somewhat recent knee surgery by Dr. Aundria Rud.  Otherwise healthy.  No IV drug use.    Past Medical History:  Diagnosis Date  . Acute lateral meniscus tear of left knee 07/23/2019   wears knee brace prn  . Back pain   . COVID-19 09/20/2019 migraines for a few days all symptoms resolved   cold like symptoms quarantine ended 99-03-2019 all symptoms resolved    There are no problems to display for this patient.   Past Surgical History:  Procedure Laterality Date  . KNEE ARTHROSCOPY WITH LATERAL MENISECTOMY Left 10/06/2019   Procedure: LEFT KNEE ARTHROSCOPY WITH PARTIAL LATERAL MENISECTOMY WITH CHONDROPLASTY TROCHLEA;  Surgeon: Yolonda Kida, MD;  Location: [facility];  Service: Orthopedics;  Laterality: Left;  60 mins  . NO PAST SURGERIES         No family history on file.  Social History   Tobacco Use  . Smoking status: Current Some Day Smoker    Packs/day: 0.50    Years: 9.00    Pack years: 4.50    Types: Cigarettes    Last attempt to quit: 01/27/2006    Years since quitting: 13.8  . Smokeless tobacco: Never Used  Vaping Use  . Vaping Use: Never used  Substance Use Topics  . Alcohol use: Yes    Comment: 2-3 glasses win per week  . Drug use: Yes    Types: Marijuana    Comment: marijuana last used 10-04-2019    Home Medications Prior to Admission medications   Medication Sig Start Date End Date  Taking? Authorizing Provider  acetaminophen (TYLENOL) 500 MG tablet Take 1,000 mg by mouth every 6 (six) hours as needed.    [provider]  HYDROcodone-acetaminophen (NORCO/VICODIN) 5-325 MG tablet Take 2 tablets by mouth every 6 (six) hours as needed for moderate pain. 10/06/19 10/05/20  Yolonda Kida, MD  methocarbamol (ROBAXIN) 500 MG tablet Take 1 tablet (500 mg total) by mouth 2 (two) times daily. 11/17/19   Benjiman Core, MD  ondansetron (ZOFRAN ODT) 4 MG disintegrating tablet Take 1 tablet (4 mg total) by mouth every 8 (eight) hours as needed for nausea or vomiting. 10/06/19   Yolonda Kida, MD  predniSONE (DELTASONE) 20 MG tablet Take 2 tablets (40 mg total) by mouth daily. 11/17/19   Benjiman Core, MD    Allergies    Patient has no known allergies.  Review of Systems   Review of Systems  Constitutional: Negative for appetite change, chills and fever.  HENT: Negative for dental problem.   Respiratory: Negative for shortness of breath.   Gastrointestinal: Negative for abdominal pain.  Musculoskeletal: Positive for back pain and neck pain. Negative for myalgias.  Skin: Negative for wound.  Neurological: Negative for weakness, numbness and headaches.    Physical Exam Updated Vital Signs BP  111/77 (BP Location: Left Arm)   Pulse 76   Temp 98.3 F (36.8 C) (Oral)   Resp 16   SpO2 99%   Physical Exam Vitals and nursing note reviewed.  HENT:     Head: Atraumatic.  Eyes:     Extraocular Movements: Extraocular movements intact.  Neck:     Comments: Tenderness to left neck musculature.  Some pain with movement to left.  At the upper cervical spine there is a skin lesion from previous abscess.  No erythema or current induration at this time.  No fluctuance. Cardiovascular:     Rate and Rhythm: Normal rate.  Pulmonary:     Breath sounds: No wheezing or rhonchi.  Musculoskeletal:     Cervical back: Neck supple.     Comments: Tenderness over  posterior neck musculature.  More of breath  Neurological:     Mental Status: He is alert.     ED Results / Procedures / Treatments   Labs (all labs ordered are listed, but only abnormal results are displayed) Labs Reviewed - No data to display  EKG None  Radiology No results found.  Procedures Procedures (including critical care time)  Medications Ordered in ED Medications  ketorolac (TORADOL) 30 MG/ML injection 30 mg (has no administration in time range)    ED Course  I have reviewed the triage vital signs and the nursing notes.  Pertinent labs & imaging results that were available during my care of the patient were reviewed by me and considered in my medical decision making (see chart for details).    MDM Rules/Calculators/A&P                          Patient with neck pain.  I think most likely musculoskeletal.  Appears tender over the trapezius on the left.  Does have site of previous abscess but this seems okay and I think less likely seeding either locally or hematogenous spread.  Treat here with Toradol, and prednisone and Robaxin at home.  I reviewed previous records.  Follow-up with his orthopedic surgeon if needed.  Will return for fevers or progressive symptoms. Final Clinical Impression(s) / ED Diagnoses Final diagnoses:  Neck muscle strain, initial encounter    Rx / DC Orders ED Discharge Orders         Ordered    predniSONE (DELTASONE) 20 MG tablet  Daily        11/17/19 0858    methocarbamol (ROBAXIN) 500 MG tablet  2 times daily        11/17/19 7619           Benjiman Core, MD 11/17/19 [phone]

## 2019-11-17 NOTE — Discharge Instructions (Addendum)
Follow up with your Orthopedic surgeon if symptoms do not improve. Return for fevers or numbness.
